# Patient Record
Sex: Female | Born: 1970 | Race: White | Hispanic: No | Smoking: Current every day smoker
Health system: Southern US, Community
[De-identification: ages and names within clinical notes are randomized; demographics above are authoritative.]

## PROBLEM LIST (undated history)

## (undated) DIAGNOSIS — B009 Herpesviral infection, unspecified: Secondary | ICD-10-CM

## (undated) DIAGNOSIS — Z87442 Personal history of urinary calculi: Secondary | ICD-10-CM

## (undated) DIAGNOSIS — T7840XA Allergy, unspecified, initial encounter: Secondary | ICD-10-CM

## (undated) HISTORY — PX: ABDOMINAL HYSTERECTOMY: SHX81

## (undated) HISTORY — DX: Herpesviral infection, unspecified: B00.9

## (undated) HISTORY — DX: Allergy, unspecified, initial encounter: T78.40XA

---

## 1998-08-19 HISTORY — PX: BREAST ENHANCEMENT SURGERY: SHX7

## 2007-06-26 ENCOUNTER — Ambulatory Visit (HOSPITAL_COMMUNITY): Admission: RE | Admit: 2007-06-26 | Discharge: 2007-06-26 | Payer: Self-pay | Admitting: Obstetrics and Gynecology

## 2007-06-26 ENCOUNTER — Encounter (INDEPENDENT_AMBULATORY_CARE_PROVIDER_SITE_OTHER): Payer: Self-pay | Admitting: Obstetrics and Gynecology

## 2009-02-06 ENCOUNTER — Ambulatory Visit (HOSPITAL_COMMUNITY): Admission: RE | Admit: 2009-02-06 | Discharge: 2009-02-07 | Payer: Self-pay | Admitting: Obstetrics and Gynecology

## 2009-02-06 ENCOUNTER — Encounter (INDEPENDENT_AMBULATORY_CARE_PROVIDER_SITE_OTHER): Payer: Self-pay | Admitting: Obstetrics and Gynecology

## 2010-05-28 ENCOUNTER — Emergency Department (HOSPITAL_COMMUNITY): Admission: EM | Admit: 2010-05-28 | Discharge: 2010-05-28 | Payer: Self-pay | Admitting: Emergency Medicine

## 2010-11-01 LAB — URINALYSIS, ROUTINE W REFLEX MICROSCOPIC
Bilirubin Urine: NEGATIVE
Glucose, UA: NEGATIVE mg/dL
Ketones, ur: NEGATIVE mg/dL
Protein, ur: 100 mg/dL — AB
pH: 6 (ref 5.0–8.0)

## 2010-11-01 LAB — POCT I-STAT, CHEM 8
HCT: 41 % (ref 36.0–46.0)
Hemoglobin: 13.9 g/dL (ref 12.0–15.0)
Potassium: 4 mEq/L (ref 3.5–5.1)
Sodium: 140 mEq/L (ref 135–145)

## 2010-11-01 LAB — URINE CULTURE: Culture  Setup Time: 201110110145

## 2010-11-01 LAB — URINE MICROSCOPIC-ADD ON

## 2010-11-26 LAB — CBC
HCT: 37.6 % (ref 36.0–46.0)
Hemoglobin: 12.8 g/dL (ref 12.0–15.0)
MCHC: 33.9 g/dL (ref 30.0–36.0)
MCV: 86 fL (ref 78.0–100.0)
MCV: 87 fL (ref 78.0–100.0)
RBC: 3.67 MIL/uL — ABNORMAL LOW (ref 3.87–5.11)
RDW: 13.5 % (ref 11.5–15.5)
WBC: 12.3 10*3/uL — ABNORMAL HIGH (ref 4.0–10.5)

## 2010-11-26 LAB — COMPREHENSIVE METABOLIC PANEL
Alkaline Phosphatase: 44 U/L (ref 39–117)
BUN: 10 mg/dL (ref 6–23)
Calcium: 9 mg/dL (ref 8.4–10.5)
Glucose, Bld: 94 mg/dL (ref 70–99)
Potassium: 4.1 mEq/L (ref 3.5–5.1)
Total Protein: 7 g/dL (ref 6.0–8.3)

## 2010-11-26 LAB — BASIC METABOLIC PANEL
Chloride: 105 mEq/L (ref 96–112)
Creatinine, Ser: 0.9 mg/dL (ref 0.4–1.2)
GFR calc Af Amer: >60 mL/min (ref >60)

## 2010-11-26 LAB — PREGNANCY, URINE: Preg Test, Ur: NEGATIVE

## 2011-01-01 NOTE — H&P (Signed)
[name], [name]               ACCOUNT NO.:  [id number]   MEDICAL RECORD NO.:  [id number]          PATIENT TYPE:  AMB   LOCATION:  SDC                           FACILITY:  [facility]   PHYSICIAN:  Sherron Monday, MD        DATE OF BIRTH:  [birth year]   DATE OF ADMISSION:  06/26/2007  DATE OF DISCHARGE:                              HISTORY & PHYSICAL   ADMISSION DIAGNOSES:  1. Menorrhagia.  2. Polyp.  3. Simple endometrial hyperplasia.   PLANNED PROCEDURE:  Hysteroscopy, D&C, polypectomy.   HISTORY OF PRESENT ILLNESS:  A 40 year old gravida 4, para 3-0-1-3  presents with clotting and heavy menstrual cycles for the last six  months as well as bloating and pressure.  [name] states that [name] was told  that [name] had a polyp or fibroid in her uterus.  Urine pregnancy test was  negative on presentation.   PAST MEDICAL HISTORY:  Not significant.   PAST SURGICAL HISTORY:  Significant for a D&C as well as breast  implants.   ALLERGIES:  PENICILLIN.   MEDICATIONS:  None.   SOCIAL HISTORY:  Occasional tobacco use, half a pack to a pack a week.  Occasional alcohol use.  No other drug use.  [name] is engaged and a  homemaker.   PAST OB/GYN HISTORY:  [name] has a history of multiple abnormal Pap smears  with colposcopy and a LEEP.  Her last was performed on May 20, 2007,  and was found to be within normal limits.  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  Her periods started when [name]  was 12 and they were regular and monthly until lately.  They have been,  for the last six months, clotting and heavy bleeding.  [name] is sexually  active with a single female partner.  They are monogamous.  [name] uses  nothing for contraception.  No endometrial bleeding, no postcoital  bleeding.  No discharge or dyspareunia.  [name] is a gravida 4, G1 was a 45-  week vaginal delivery of female infant weighing 4 pounds 9 ounces.  He is  disabled.  G2 was a term vaginal delivery female infant weighing 8  pounds complicated by PPROM at 27 weeks.  G3 was a missed abortion  requiring a D&C.  G4 was a 35-week spontaneous vaginal delivery of a  female weighing 4 pounds 12 ounces.  [name] also has three soon-to-be  stepchildren and so they have six children ages 50-17.   FAMILY HISTORY:  Significant for diabetes in a father and paternal  grandmother.  Hypertension in maternal grandmother and paternal  grandmother.  Cancer of liver, bone, and throat in maternal grandfather.  Uterine and liver cancer in a paternal grandmother.  Birth defects in  her son who is also mentally retarded.   PHYSICAL EXAMINATION:  VITAL SIGNS:  [name] is 5 feet 5-1/2 inches tall,  weighs 113 pounds.  Blood pressure 100/70.  GENERAL:  [name] is in no apparent distress.  CARDIOVASCULAR:  Regular rate and rhythm.  LUNGS:  Clear to auscultation bilaterally.  NECK:  Without  lymphadenopathy.  HEENT:  Mucous membranes are moist.  Thyroid is within normal limits.  BACK:  No costovertebral angle tenderness.  BREASTS:  C to D cup, no masses, nontender, and no distortion.  ABDOMEN:  Soft, nontender, nondistended, and positive bowel sounds.  EXTREMITIES:  Symmetric and nontender.  PELVIC:  Normal external female genitalia and normal Bartholin's,  urethra, and Skene's glands.  The cervix and vagina are without lesions.  No cervical motion tenderness.  Her uterus is anteverted, approximately  six weeks size, and otherwise normal.  Her adnexa are with no masses and  nontender.  An endometrial biopsy was performed.  The uterus sounded to  approximately 2 cm and this biopsy was benign.  Part of this being  performed is saline infused sonohysterogram was performed and revealed a  polyp in her uterine cavity.   LABORATORY DATA:  At this visit her hemoglobin is 13.7.  Her urine was  negative.   [name] returned to discuss with her husband with the endometrial biopsy of  simple hyperplasia, the less than 1% chance of endometrial cancer, and  her  options included hysterectomy versus polypectomy D&C, and plus or  minus on the progesterone therapy.  I discussed with the patient and her  husband the options.  They were unsure if they desired future fertility,  so polypectomy and D&C is a likely better option.  Reviewed with them  the need to do q.3 month biopsies to assure no further progression and  the D&C to assure that there is no more serious hyperplasia than simple  and the need to delay conception for approximately six months.  I  discussed with the patient and her husband the risks, benefits, and  alternatives including bleeding, infection, damage to the surrounding  organs, and possibility of continued hyperplasia.  They voiced  understanding of all of this and wished to proceed.  With the pathology  report, we will discuss if we need to start progesterone therapy.      Sherron Monday, MD  Electronically Signed     JB/MEDQ  D:  06/25/2007  T:  06/25/2007  Job:  [id number]

## 2011-01-01 NOTE — Op Note (Signed)
[name], [name]               ACCOUNT NO.:  [id number]   MEDICAL RECORD NO.:  [id number]          PATIENT TYPE:  AMB   LOCATION:  SDC                           [facility]ACILITY:  [facility]   PHYSICIAN:  Sherron Monday, MD        DATE OF BIRTH:  1971-07-23   DATE OF PROCEDURE:  02/06/2009  DATE OF DISCHARGE:                               OPERATIVE REPORT   PREOPERATIVE DIAGNOSES:  1. Menorrhagia.  2. History simple hyperplasia.  3. Uterine polyp.   POSTOPERATIVE DIAGNOSES:  1. Menorrhagia.  2. History simple hyperplasia.  3. Uterine polyp.   PROCEDURES:  1. Total vaginal hysterectomy.  2. Cystoscopy.   SURGEON:  Sherron Monday, MD   ASSISTANT:  Malachi Pro. Ambrose Mantle, MD   ANESTHESIA:  General.   FINDINGS:  Normal uterus, normal bilateral ovaries and tubes, blue from  both ureteral orifices after indigo carmine instillation at the end of  surgery.   SPECIMEN:  Uterus and cervix to pathology.   ESTIMATED BLOOD LOSS:  100 mL.   INTRAVENOUS FLUIDS:  800 mL.   URINE OUTPUT:  400 mL clear urine at the end of the procedure.   COMPLICATIONS:  None.   DISPOSITION:  Stable to PACU.   PROCEDURE:  After informed consent was reviewed with the patient  including risks, benefits, and alternatives of surgical procedure.  She  was transported in stable condition to the OR.  She was placed on the  table in supine position.  General anesthesia was induced and found be  adequate.  She was then placed in the [place] stirrups, prepped and  draped in the normal sterile fashion.  Using a heavy weighted speculum  and a Sims retractor, her cervix was easily identified and grasped with  Loman [place] and infused with 20 mL of Pitressin.  It was then  circumscribed with Bovie cautery.  The posterior cul-de-sac was then  entered sharply and a stitch was placed in the midline of the posterior  cul-de-sac, tacking the peritoneum to the vaginal mucosa.  The  uterosacral ligaments were plicated bilaterally and  the stitches were  held.  The long weighted speculum was placed, another bite was the  cardinal ligaments, and uterine arteries.  The uterine cornu was doubly  ligated and excised.  The ovaries were then identified and inspected.  The posterior cuff was noted to be bleeding.  This was made hemostatic  with Bovie cautery and a stitch.  The uterosacral ligaments were  plicated using a plicating suture and then the pedicales were tied to  help pull the ligaments together.  The cuff was closed with 2-0 Vicryl  in a running locked fashion.  The bladder was inspected using a  cystoscope.  After indigo carmine had been given, spurts from bilateral  ureteral orifices were noted.  Instruments were removed from the vagina  and the patient was returned to supine position, awakened in stable  condition, and transported to the PACU following the procedure.      Sherron Monday, MD  Electronically Signed     JB/MEDQ  D:  02/06/2009  T:  02/07/2009  Job:  [id number]

## 2011-01-01 NOTE — H&P (Signed)
[name], [name]               ACCOUNT NO.:  [id number]   MEDICAL RECORD NO.:  [id number]          PATIENT TYPE:  AMB   LOCATION:  SDC                           [facility]   PHYSICIAN:  Sherron Monday, MD        DATE OF BIRTH:  July 15, 1971   DATE OF ADMISSION:  02/06/2009  DATE OF DISCHARGE:                              HISTORY & PHYSICAL   ADMISSION DIAGNOSES:  Menorrhagia, history of simple hyperplasia,  desires definitive management.   PROCEDURES PLANNED:  Total vaginal hysterectomy, cystoscopy.   HISTORY OF PRESENT ILLNESS:  [name] is a 40 year old G3, P 3-0-0-3  who presents with a history of menorrhagia and in the past had an  endometrial biopsy that revealed simple hyperplasia and a polyp, which  was treated with a hysteroscopy, D and C.  She represents with the same  complaints and states she is ready for definitive management.  Discussed  with her the risks, benefits, and alternatives of the surgery including  bleeding, infection, damage to the surrounding organs, and no future  fertility.  She voices understanding and wishes to proceed.   PAST MEDICAL HISTORY:  Nonsignificant.   PAST SURGICAL HISTORY:  Significant for hysteroscopy, D and C in 2008 as  well as a breast augmentation.   PAST OBSTETRICS AND GYNECOLOGIC HISTORY:  She is a G3, P 3-0-0-3 with a  term vaginal delivery and two 35-week vaginal deliveries.  She also has  3 stepchildren, so in the household they will have 6 kids.  She states  she has had an abnormal Pap smear and a colposcopy and they have been  normal since, no sexually transmitted diseases.  She is sexually active  and monogamous with her husband.  She has regular heavy monthly menses  and has no complaints of dyspareunia, intermenstrual bleeding, or  postcoital bleeding.   MEDICATIONS:  None.   ALLERGIES:  PENICILLIN, which is as a child she had a choking reaction.   SOCIAL HISTORY:  History of occasional tobacco use.  She is  trying to  quit.  Occasional alcohol use.  No other drug use.   FAMILY HISTORY:  Significant for diabetes in a father and paternal  grandmother.  Hypertension in maternal grandmother and paternal  grandmother.  Paternal grandfather with liver, bone, and throat cancer.  Paternal grandmother with uterine and liver cancer.  A son with mental  retardation.   PHYSICAL EXAMINATION:  VITAL SIGNS:  Afebrile.  Vital signs stable.  GENERAL:  No apparent distress.  CARDIOVASCULAR:  Regular rate and rhythm.  LUNGS:  Clear to auscultation bilaterally.  ABDOMEN:  Soft, nontender, and nondistended with good bowel sounds.  EXTREMITIES:  Symmetric and nontender.  NECK:  No lymphadenopathy.  Thyroid within normal limits.  HEENT: Mucous membranes are moist.  BREASTS:  D cup.  No masses, nontender.  No distortion, obvious  implants.  BACK:  No costovertebral angle tenderness.  PELVIC:  Normal external female genitalia.  Normal Bartholin, urethral  and Skene glands, good support.  Cervix and vagina without lesions.  No  cervical motion tenderness.  Normal  uterus and adnexa, no masses,  nontender.   She recently has had an endometrial biopsy that was benign.  This is a  40 year old G3, P3 who desires definitive management for menorrhagia  with history of simple hyperplasia and endometrial polyps.  After  discussing options for the hysterectomy, we will proceed with a  hysterectomy on February 06, 2009.  We have discussed risks, benefits, and  alternatives.  She voices understanding to all of this as well as to  recovery.      Sherron Monday, MD  Electronically Signed     JB/MEDQ  D:  02/03/2009  T:  02/04/2009  Job:  [id number]

## 2011-01-01 NOTE — Op Note (Signed)
[name], [name]               ACCOUNT NO.:  [id number]   MEDICAL RECORD NO.:  [id number]          PATIENT TYPE:  AMB   LOCATION:  SDC                           [facility]   PHYSICIAN:  Sherron Monday, MD        DATE OF BIRTH:  [birth year]   DATE OF PROCEDURE:  06/26/2007  DATE OF DISCHARGE:                               OPERATIVE REPORT   PREOPERATIVE DIAGNOSIS:  Menorrhagia, uterine polyp, simple hyperplasia.   POSTOPERATIVE DIAGNOSIS:  Menorrhagia, uterine polyp, simple  hyperplasia.   PROCEDURE:  Hysteroscopy, polypectomy, dilation and curettage.   SURGEON:  Sherron Monday, M.D.   ASSISTANT:  None.   ANESTHESIA:  General with 13 mL of 1% lidocaine for a cervical block.   FINDINGS:  Uterine polyp noted at the left cornu.   SPECIMENS:  Polyps and endometrial curettings to pathology.   ESTIMATED BLOOD LOSS:  Minimal.   IV FLUIDS:  500 mL.   URINE OUTPUT:  I&O catheterization at the start of the procedure.   COMPLICATIONS:  None.   DISPOSITION:  Stable to PACU.   PROCEDURE:  After informed consent was reviewed with the patient  including risks, benefits and alternatives of surgical procedure with  simple hyperplasia having less than 1% chance of being a endometrial  carcinoma and [name] not being sure of her future fertility  desires, decision was made to proceed with a D&C hysteroscopy,  polypectomy.  I discussed with the patient risks, benefits and  alternatives of the surgical procedure including but not limited to  bleeding, infection, damage to surrounding organs, bowels, bladder, and  damage to the uterus.   The patient was taken to the operating room, placed on the table in  supine position.  General anesthesia was induced and found to be  adequate.  She was then placed in the yellow fin stirrups, prepped and  draped in the normal sterile fashion.  Her bladder was sterilely drained  using a heavy weighted speculum and a Sims retractor.  Her cervix was  easily visualized, grasped with a single-toothed tenaculum and dilated  to accommodate the diagnostic hysteroscope.  Her uterus sounded to 8.5  cm.  The diagnostic hysteroscope was introduced in the cavity, and a  brief survey was performed revealing both ostia that were easily seen  and a polyp at her left cornu.  The hysteroscope was removed.  The polyp  was grasped with polyp forceps and removed easily.  Inspection was  performed and again with the hysteroscope and found that the polyp had  been removed.  The D&C was then performed.  At the end of the  procedure, the bleeding was within normal limits.  The deficit was  approximately 25 mL.  The patient tolerated the procedure well.  Sponge,  lap and needle counts were correct x2.  The patient was awake and in  stable condition, transferred to the PACU.      Sherron Monday, MD  Electronically Signed     JB/MEDQ  D:  06/26/2007  T:  06/26/2007  Job:  [id number]

## 2011-01-01 NOTE — Discharge Summary (Signed)
NAMEJERI, [name]               ACCOUNT NO.:  [id number]   MEDICAL RECORD NO.:  [id number]          PATIENT TYPE:  OIB   LOCATION:  9319                          [facility]ACILITY:  [facility]   PHYSICIAN:  Sherron Monday, MD        DATE OF BIRTH:  1971/01/14   DATE OF ADMISSION:  02/06/2009  DATE OF DISCHARGE:  02/07/2009                               DISCHARGE SUMMARY   ADMISSION DIAGNOSES:  Menorrhagia, history of simple hyperplasia,  uterine polyps.   POSTOPERATIVE DIAGNOSES:  Menorrhagia, history of simple hyperplasia,  uterine polyps.   PROCEDURE:  During hospitalization, total vaginal hysterectomy and  cystoscopy.  For the full H and P, please refer to the dictated note.  However in brief, 40 year old G3, P3, who presented with menorrhagia and  has a history of simple endometrial hypoplasia, who again represents  with menorrhagia and desired definitive management.  She underwent a  total vaginal hysterectomy on 21st without complication and her  postoperative course was relatively uncomplicated.  She remained  afebrile with stable vital signs throughout.  Her pain was well  controlled.  She was ambulating and voiding at the time of discharge and  she was able to tolerate a diet.  She was discharged home with routine  discharge instructions and numbers to call with any questions or  problems as well as prescriptions for Vicodin and Motrin.  She will  follow up in approximately 2 weeks.  At this time, we will discuss  pathology and to make sure she is doing all right.      Sherron Monday, MD  Electronically Signed     JB/MEDQ  D:  02/07/2009  T:  02/08/2009  Job:  [id number]

## 2011-05-27 ENCOUNTER — Emergency Department (HOSPITAL_COMMUNITY)
Admission: EM | Admit: 2011-05-27 | Discharge: 2011-05-27 | Disposition: A | Discharge status: Home / Self Care | Payer: Medicaid Other | Attending: Emergency Medicine | Admitting: Emergency Medicine

## 2011-05-27 DIAGNOSIS — R3915 Urgency of urination: Secondary | ICD-10-CM | POA: Insufficient documentation

## 2011-05-27 DIAGNOSIS — R319 Hematuria, unspecified: Secondary | ICD-10-CM | POA: Insufficient documentation

## 2011-05-27 DIAGNOSIS — R109 Unspecified abdominal pain: Secondary | ICD-10-CM | POA: Insufficient documentation

## 2011-05-27 DIAGNOSIS — R300 Dysuria: Secondary | ICD-10-CM | POA: Insufficient documentation

## 2011-05-27 DIAGNOSIS — Z87440 Personal history of urinary (tract) infections: Secondary | ICD-10-CM | POA: Insufficient documentation

## 2011-05-27 DIAGNOSIS — N3090 Cystitis, unspecified without hematuria: Secondary | ICD-10-CM | POA: Insufficient documentation

## 2011-05-27 LAB — URINALYSIS, ROUTINE W REFLEX MICROSCOPIC
Glucose, UA: NEGATIVE mg/dL
Specific Gravity, Urine: 1.004 — ABNORMAL LOW (ref 1.005–1.030)
pH: 7.5 (ref 5.0–8.0)

## 2011-05-27 LAB — URINE MICROSCOPIC-ADD ON

## 2011-05-28 LAB — CBC
HCT: 37.6
Hemoglobin: 12.9
WBC: 7.5

## 2011-05-29 LAB — URINE CULTURE

## 2011-09-22 ENCOUNTER — Emergency Department: Payer: Self-pay | Admitting: Emergency Medicine

## 2011-09-29 ENCOUNTER — Emergency Department: Payer: Self-pay | Admitting: Emergency Medicine

## 2013-07-12 ENCOUNTER — Other Ambulatory Visit: Payer: Self-pay | Admitting: Family Medicine

## 2013-07-12 MED ORDER — AZITHROMYCIN 250 MG PO TABS: ORAL_TABLET | ORAL | Status: DC

## 2013-07-12 NOTE — Progress Notes (Signed)
Pt here with her son who is sick with likley strep and URI. Unable to be seen all of our appt blocked Sore throat, , no fever- PCN allergy, will send in Zpak

## 2013-07-26 ENCOUNTER — Encounter: Payer: Self-pay | Admitting: Family Medicine

## 2013-07-26 ENCOUNTER — Ambulatory Visit (INDEPENDENT_AMBULATORY_CARE_PROVIDER_SITE_OTHER): Payer: Medicaid Other | Admitting: Family Medicine

## 2013-07-26 VITALS — BP 112/78 | HR 82 | Temp 98.7°F | Resp 18 | Ht 65.0 in | Wt 124.0 lb

## 2013-07-26 DIAGNOSIS — J019 Acute sinusitis, unspecified: Secondary | ICD-10-CM

## 2013-07-26 DIAGNOSIS — Z13220 Encounter for screening for lipoid disorders: Secondary | ICD-10-CM

## 2013-07-26 DIAGNOSIS — Z90710 Acquired absence of both cervix and uterus: Secondary | ICD-10-CM

## 2013-07-26 DIAGNOSIS — Z01419 Encounter for gynecological examination (general) (routine) without abnormal findings: Secondary | ICD-10-CM

## 2013-07-26 DIAGNOSIS — F17200 Nicotine dependence, unspecified, uncomplicated: Secondary | ICD-10-CM

## 2013-07-26 DIAGNOSIS — B009 Herpesviral infection, unspecified: Secondary | ICD-10-CM

## 2013-07-26 DIAGNOSIS — J329 Chronic sinusitis, unspecified: Secondary | ICD-10-CM | POA: Insufficient documentation

## 2013-07-26 DIAGNOSIS — Z1231 Encounter for screening mammogram for malignant neoplasm of breast: Secondary | ICD-10-CM

## 2013-07-26 DIAGNOSIS — Z0000 Encounter for general adult medical examination without abnormal findings: Secondary | ICD-10-CM

## 2013-07-26 MED ORDER — VALACYCLOVIR HCL 500 MG PO TABS: 500.0000 mg | ORAL_TABLET | Freq: Two times a day (BID) | ORAL | Status: DC

## 2013-07-26 MED ORDER — NORGESTIMATE-ETH ESTRADIOL 0.25-35 MG-MCG PO TABS: 1.0000 | ORAL_TABLET | Freq: Every day | ORAL | Status: DC

## 2013-07-26 NOTE — Assessment & Plan Note (Signed)
She was only treated for one outbreak. 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 At this time we'll try treating only during exacerbations instead of daily prophylaxis

## 2013-07-26 NOTE — Progress Notes (Signed)
   Subjective:    Patient ID: [name], female    DOB: 10/02/1970, 42 y.o.   MRN: [id number]  HPI Patient here to reestablish care. She was last seen a couple years ago. She is followed by GYN however she needs a referral to continue followup. She is status post hysterectomy secondary to heavy bleeding and fibroids. She's been maintained on Sprintec for birth control however has been out for the past 3 weeks as she was due for an appointment. 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 She does have some decongestant home which helps.   Review of Systems  GEN- denies fatigue, fever, weight loss,weakness, recent illness HEENT- denies eye drainage, change in vision, nasal discharge, CVS- denies chest pain, palpitations RESP- denies SOB, cough, wheeze ABD- denies N/V, change in stools, abd pain GU- denies dysuria, hematuria, dribbling, incontinence MSK- denies joint pain, muscle aches, injury Neuro- denies headache, dizziness, syncope, seizure activity      Objective:   Physical Exam GEN- NAD, alert and oriented x3 HEENT- PERRL, EOMI, non injected sclera, pink conjunctiva, MMM, oropharynx clear injection, TM clear bilat no effusion, + maxillary sinus tenderness, nares clear Neck- Supple, no LAD CVS- RRR, no murmur RESP-CTAB EXT- No edema Pulses- Radial 2+         Assessment & Plan:

## 2013-07-26 NOTE — Assessment & Plan Note (Signed)
She was recently on antibiotics. We'll hold on her decongestant the next couple of days and see if she improves.

## 2013-07-26 NOTE — Assessment & Plan Note (Signed)
I will send in a new referral for her GYN tissue to reestablish care. Mammogram is also to be done. I refilled her Sprintec for the next couple of months

## 2013-07-26 NOTE — Assessment & Plan Note (Signed)
Counseled on cessation 

## 2013-07-26 NOTE — Patient Instructions (Signed)
Use Decongestant and mucniex Call if no improvement For outbreaks use Valtrex  1 tablet twice a day for 5 days Mammogram to be scheduled Return for fasting labs  Referral to GYN- Dr. Ellyn Hack F/U as needed

## 2013-08-24 ENCOUNTER — Other Ambulatory Visit: Payer: Self-pay | Admitting: Family Medicine

## 2013-08-24 ENCOUNTER — Ambulatory Visit
Admission: RE | Admit: 2013-08-24 | Discharge: 2013-08-24 | Disposition: A | Discharge status: Home / Self Care | Payer: Medicaid Other | Source: Ambulatory Visit | Attending: Family Medicine | Admitting: Family Medicine

## 2013-08-24 DIAGNOSIS — Z1231 Encounter for screening mammogram for malignant neoplasm of breast: Secondary | ICD-10-CM

## 2013-08-27 ENCOUNTER — Other Ambulatory Visit: Payer: Self-pay | Admitting: Family Medicine

## 2013-08-27 DIAGNOSIS — R928 Other abnormal and inconclusive findings on diagnostic imaging of breast: Secondary | ICD-10-CM

## 2013-09-03 ENCOUNTER — Ambulatory Visit
Admission: RE | Admit: 2013-09-03 | Discharge: 2013-09-03 | Disposition: A | Discharge status: Home / Self Care | Payer: Medicaid Other | Source: Ambulatory Visit | Attending: Family Medicine | Admitting: Family Medicine

## 2013-09-03 ENCOUNTER — Other Ambulatory Visit: Payer: Self-pay | Admitting: Family Medicine

## 2013-09-03 DIAGNOSIS — R921 Mammographic calcification found on diagnostic imaging of breast: Secondary | ICD-10-CM

## 2013-09-03 DIAGNOSIS — R928 Other abnormal and inconclusive findings on diagnostic imaging of breast: Secondary | ICD-10-CM

## 2013-09-09 ENCOUNTER — Telehealth: Payer: Self-pay | Admitting: *Deleted

## 2013-09-09 NOTE — Telephone Encounter (Signed)
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

## 2013-09-10 NOTE — Telephone Encounter (Signed)
If her symptoms are body aches and chills, this sounds most consistent with a virus. If having high fever  >101--let me know-- then I would consider Tamiflu. Otherwise, sounds like a virus which will just have to run its course. Use Tylenol and Motrin to help with the aches and pains in the meantime.

## 2013-09-13 ENCOUNTER — Ambulatory Visit
Admission: RE | Admit: 2013-09-13 | Discharge: 2013-09-13 | Disposition: A | Discharge status: Home / Self Care | Payer: Medicaid Other | Source: Ambulatory Visit | Attending: Family Medicine | Admitting: Family Medicine

## 2013-09-13 DIAGNOSIS — R921 Mammographic calcification found on diagnostic imaging of breast: Secondary | ICD-10-CM

## 2013-09-14 NOTE — Telephone Encounter (Signed)
Pt feeling much better.  Did not have high fever.  Has treated effectively with OTC relief.

## 2013-10-05 ENCOUNTER — Ambulatory Visit
Admission: RE | Admit: 2013-10-05 | Discharge: 2013-10-05 | Disposition: A | Discharge status: Home / Self Care | Payer: Medicaid Other | Source: Ambulatory Visit | Attending: Family Medicine | Admitting: Family Medicine

## 2013-10-05 ENCOUNTER — Other Ambulatory Visit: Payer: Self-pay | Admitting: Family Medicine

## 2013-10-05 DIAGNOSIS — N61 Mastitis without abscess: Secondary | ICD-10-CM

## 2014-05-30 ENCOUNTER — Other Ambulatory Visit: Payer: Self-pay | Admitting: Family Medicine

## 2014-05-30 NOTE — Telephone Encounter (Signed)
Medication filled x1 with no refills.   Requires office visit before any further refills can be given.   Letter sent.  

## 2014-06-21 ENCOUNTER — Telehealth: Payer: Self-pay | Admitting: Family Medicine

## 2014-06-21 MED ORDER — VALACYCLOVIR HCL 500 MG PO TABS: ORAL_TABLET | ORAL | Status: DC

## 2014-06-21 NOTE — Telephone Encounter (Signed)
Prescription sent to pharmacy. .   Call placed to patient and patient made aware.  

## 2014-06-21 NOTE — Telephone Encounter (Signed)
Patient no longer has insurance and can't afford this medication from [facility] she has a coupon for [facility] and would like this medication called into [facility] [address]. Please call her once this has been called in.

## 2014-09-29 ENCOUNTER — Encounter (HOSPITAL_COMMUNITY): Payer: Self-pay | Admitting: Emergency Medicine

## 2014-09-29 ENCOUNTER — Emergency Department (HOSPITAL_COMMUNITY)
Admission: EM | Admit: 2014-09-29 | Discharge: 2014-09-29 | Disposition: A | Discharge status: Home / Self Care | Payer: Medicaid Other | Attending: Emergency Medicine | Admitting: Emergency Medicine

## 2014-09-29 ENCOUNTER — Emergency Department (HOSPITAL_COMMUNITY)
Admission: EM | Admit: 2014-09-29 | Discharge: 2014-09-29 | Disposition: A | Discharge status: Home / Self Care | Payer: Self-pay | Attending: Emergency Medicine | Admitting: Emergency Medicine

## 2014-09-29 ENCOUNTER — Emergency Department (HOSPITAL_COMMUNITY): Payer: Medicaid Other

## 2014-09-29 ENCOUNTER — Encounter (HOSPITAL_COMMUNITY): Payer: Self-pay

## 2014-09-29 DIAGNOSIS — R42 Dizziness and giddiness: Secondary | ICD-10-CM | POA: Insufficient documentation

## 2014-09-29 DIAGNOSIS — R4789 Other speech disturbances: Secondary | ICD-10-CM

## 2014-09-29 DIAGNOSIS — H539 Unspecified visual disturbance: Secondary | ICD-10-CM

## 2014-09-29 DIAGNOSIS — R110 Nausea: Secondary | ICD-10-CM | POA: Insufficient documentation

## 2014-09-29 DIAGNOSIS — H547 Unspecified visual loss: Secondary | ICD-10-CM | POA: Insufficient documentation

## 2014-09-29 DIAGNOSIS — Z8619 Personal history of other infectious and parasitic diseases: Secondary | ICD-10-CM | POA: Insufficient documentation

## 2014-09-29 DIAGNOSIS — Z720 Tobacco use: Secondary | ICD-10-CM | POA: Insufficient documentation

## 2014-09-29 DIAGNOSIS — R519 Headache, unspecified: Secondary | ICD-10-CM | POA: Insufficient documentation

## 2014-09-29 DIAGNOSIS — Z793 Long term (current) use of hormonal contraceptives: Secondary | ICD-10-CM | POA: Insufficient documentation

## 2014-09-29 DIAGNOSIS — H5340 Unspecified visual field defects: Secondary | ICD-10-CM

## 2014-09-29 DIAGNOSIS — Z880 Allergy status to penicillin: Secondary | ICD-10-CM | POA: Insufficient documentation

## 2014-09-29 DIAGNOSIS — G43509 Persistent migraine aura without cerebral infarction, not intractable, without status migrainosus: Secondary | ICD-10-CM | POA: Insufficient documentation

## 2014-09-29 DIAGNOSIS — Z79899 Other long term (current) drug therapy: Secondary | ICD-10-CM | POA: Insufficient documentation

## 2014-09-29 DIAGNOSIS — R51 Headache: Secondary | ICD-10-CM | POA: Insufficient documentation

## 2014-09-29 LAB — RAPID URINE DRUG SCREEN, HOSP PERFORMED
Amphetamines: NOT DETECTED
BENZODIAZEPINES: NOT DETECTED
Barbiturates: NOT DETECTED
Cocaine: NOT DETECTED
Opiates: NOT DETECTED
Tetrahydrocannabinol: NOT DETECTED

## 2014-09-29 LAB — CBC
HCT: 40.9 % (ref 36.0–46.0)
HEMOGLOBIN: 13.7 g/dL (ref 12.0–15.0)
MCH: 28 pg (ref 26.0–34.0)
MCHC: 33.5 g/dL (ref 30.0–36.0)
MCV: 83.6 fL (ref 78.0–100.0)
Platelets: 297 10*3/uL (ref 150–400)
RBC: 4.89 MIL/uL (ref 3.87–5.11)
RDW: 12.9 % (ref 11.5–15.5)
WBC: 8.6 10*3/uL (ref 4.0–10.5)

## 2014-09-29 LAB — I-STAT CHEM 8, ED
BUN: 10 mg/dL (ref 6–23)
CALCIUM ION: 1.26 mmol/L — AB (ref 1.12–1.23)
Chloride: 103 mmol/L (ref 96–112)
Creatinine, Ser: 0.9 mg/dL (ref 0.50–1.10)
Glucose, Bld: 101 mg/dL — ABNORMAL HIGH (ref 70–99)
HEMATOCRIT: 42 % (ref 36.0–46.0)
HEMOGLOBIN: 14.3 g/dL (ref 12.0–15.0)
Potassium: 4.1 mmol/L (ref 3.5–5.1)
Sodium: 140 mmol/L (ref 135–145)
TCO2: 24 mmol/L (ref 0–100)

## 2014-09-29 LAB — ETHANOL: Alcohol, Ethyl (B): <5 mg/dL (ref 0–9)

## 2014-09-29 LAB — COMPREHENSIVE METABOLIC PANEL
ALBUMIN: 4.1 g/dL (ref 3.5–5.2)
ALK PHOS: 60 U/L (ref 39–117)
ALT: 16 U/L (ref 0–35)
AST: 21 U/L (ref 0–37)
Anion gap: 9 (ref 5–15)
BUN: 8 mg/dL (ref 6–23)
CHLORIDE: 105 mmol/L (ref 96–112)
CO2: 25 mmol/L (ref 19–32)
Calcium: 9.5 mg/dL (ref 8.4–10.5)
Creatinine, Ser: 0.82 mg/dL (ref 0.50–1.10)
GFR calc Af Amer: >90 mL/min (ref >90)
GFR calc non Af Amer: 86 mL/min — ABNORMAL LOW (ref >90)
Glucose, Bld: 109 mg/dL — ABNORMAL HIGH (ref 70–99)
Potassium: 4.3 mmol/L (ref 3.5–5.1)
SODIUM: 139 mmol/L (ref 135–145)
TOTAL PROTEIN: 7.1 g/dL (ref 6.0–8.3)
Total Bilirubin: 0.5 mg/dL (ref 0.3–1.2)

## 2014-09-29 LAB — DIFFERENTIAL
BASOS PCT: 1 % (ref 0–1)
Basophils Absolute: 0 10*3/uL (ref 0.0–0.1)
EOS ABS: 0.1 10*3/uL (ref 0.0–0.7)
EOS PCT: 1 % (ref 0–5)
Lymphocytes Relative: 26 % (ref 12–46)
Lymphs Abs: 2.2 10*3/uL (ref 0.7–4.0)
Monocytes Absolute: 0.6 10*3/uL (ref 0.1–1.0)
Monocytes Relative: 6 % (ref 3–12)
Neutro Abs: 5.7 10*3/uL (ref 1.7–7.7)
Neutrophils Relative %: 66 % (ref 43–77)

## 2014-09-29 LAB — URINE MICROSCOPIC-ADD ON

## 2014-09-29 LAB — URINALYSIS, ROUTINE W REFLEX MICROSCOPIC
Bilirubin Urine: NEGATIVE
Glucose, UA: NEGATIVE mg/dL
KETONES UR: NEGATIVE mg/dL
Leukocytes, UA: NEGATIVE
NITRITE: NEGATIVE
PROTEIN: NEGATIVE mg/dL
Specific Gravity, Urine: 1.009 (ref 1.005–1.030)
UROBILINOGEN UA: 0.2 mg/dL (ref 0.0–1.0)
pH: 7.5 (ref 5.0–8.0)

## 2014-09-29 LAB — I-STAT TROPONIN, ED: TROPONIN I, POC: 0 ng/mL (ref 0.00–0.08)

## 2014-09-29 LAB — APTT: APTT: 31 s (ref 24–37)

## 2014-09-29 LAB — PROTIME-INR
INR: 0.99 (ref 0.00–1.49)
Prothrombin Time: 13.2 seconds (ref 11.6–15.2)

## 2014-09-29 MED ORDER — DIPHENHYDRAMINE HCL 50 MG/ML IJ SOLN
25.0000 mg | Freq: Once | INTRAMUSCULAR | Status: AC
  Administered 2014-09-29: 25 mg via INTRAVENOUS
  Filled 2014-09-29: qty 1

## 2014-09-29 MED ORDER — DEXAMETHASONE SODIUM PHOSPHATE 10 MG/ML IJ SOLN
10.0000 mg | Freq: Once | INTRAMUSCULAR | Status: AC
  Administered 2014-09-29: 10 mg via INTRAVENOUS
  Filled 2014-09-29: qty 1

## 2014-09-29 MED ORDER — METOCLOPRAMIDE HCL 5 MG/ML IJ SOLN
10.0000 mg | Freq: Once | INTRAMUSCULAR | Status: AC
  Administered 2014-09-29: 10 mg via INTRAVENOUS
  Filled 2014-09-29: qty 2

## 2014-09-29 MED ORDER — LORAZEPAM 1 MG PO TABS
2.0000 mg | ORAL_TABLET | Freq: Once | ORAL | Status: AC
  Administered 2014-09-29: 2 mg via ORAL
  Filled 2014-09-29: qty 2

## 2014-09-29 NOTE — Discharge Instructions (Signed)
Blurred Vision You have been seen today complaining of blurred vision. This means you have a loss of ability to see small details.  CAUSES  Blurred vision can be a symptom of underlying eye problems, such as:  Aging of the eye (presbyopia).  Glaucoma.  Cataracts.  Eye infection.  Eye-related migraine.  Diabetes mellitus.  Fatigue.  Migraine headaches.  High blood pressure.  Breakdown of the back of the eye (macular degeneration).  Problems caused by some medications. The most common cause of blurred vision is the need for eyeglasses or a new prescription. Today in the emergency department, no cause for your blurred vision can be found. SYMPTOMS  Blurred vision is the loss of visual sharpness and detail (acuity). DIAGNOSIS  Should blurred vision continue, you should see your caregiver. If your caregiver is your primary care physician, he or she may choose to refer you to another specialist.  TREATMENT  Do not ignore your blurred vision. Make sure to have it checked out to see if further treatment or referral is necessary. SEEK MEDICAL CARE IF:  You are unable to get into a specialist so we can help you with a referral. SEEK IMMEDIATE MEDICAL CARE IF: You have severe eye pain, severe headache, or sudden loss of vision. MAKE SURE YOU:   Understand these instructions.  Will watch your condition.  Will get help right away if you are not doing well or get worse. Document Released: 08/08/2003 Document Revised: 10/28/2011 Document Reviewed: 03/09/2008 [facility] Patient Information 2015 [place], [place]. This information is not intended to replace advice given to you by your health care provider. Make sure you discuss any questions you have with your health care provider.  Aphasia Aphasia is a neurological disorder caused by damage to the parts of the brain that control language. CAUSES  Aphasia is not a disease, but a symptom of brain damage. Aphasia is commonly seen in adults  who have suffered a stroke. Aphasia also can result from:  A brain tumor.  Infection.  Head injury.  A rare type of dementia called Primary Progressive Aphasia. Common types of dementia may be associated with aphasia but can also exist without language problems. SYMPTOMS  Primary signs of the disorder include:  Problems expressing oneself when speaking.  Trouble understanding speech.  Difficulty with reading and writing.  Speaking in short or incomplete sentences.  Speaking in sentences that don't make sense.  Speaking unrecognizable words.  Interpreting figurative language literally.  Writing sentences that don't make sense. The type and severity of language problems depend on the precise location and extent of the damaged brain tissue. Aphasia can be divided into four broad categories:  Expressive aphasia - difficulty in conveying thoughts through speech or writing. The patient knows what they want to say, but cannot find the words they need.  Receptive aphasia - difficulty understanding spoken or written language. The patient hears the voice or sees the print but cannot make sense of the words.  Anomic or amnesia aphasia - difficulty in using the correct names for particular objects, people, places, or events. This is the least severe form of aphasia.  Global aphasia results from severe and extensive damage to the language areas of the brain. Patients lose almost all language function, both comprehension (understanding) and expression. They cannot speak, understand speech, read, or write. TREATMENT  Sometimes an individual will completely recover from aphasia without treatment. In most cases, language therapy should begin as soon as possible. Language therapy should be tailored to  the individual needs of the patient. Therapy with a speech pathologist involves exercises in which patients:  Read.  Write.  Follow directions.  Repeat what they hear.  Computer-aided  therapy may also be used. PROGNOSIS  The outcome of aphasia is difficult to predict. People who are younger or have less extensive brain damage do better. The location of the injury is also important. The location is a clue to prognosis. In general, patients tend to recover skills in language comprehension (understanding) more completely than those skills involving expression (speaking or writing). Document Released: 04/27/2002 Document Revised: 10/28/2011 Document Reviewed: 10/25/2013 [facility] Patient Information 2015 [place]. This information is not intended to replace advice given to you by your health care provider. Make sure you discuss any questions you have with your health care provider.

## 2014-09-29 NOTE — ED Provider Notes (Addendum)
CSN: [id number]     Arrival date & time 09/29/14  1525 History   None    Chief Complaint  Patient presents with  . Aphasia     (Consider location/radiation/quality/duration/timing/severity/associated sxs/prior Treatment) HPI Comments: Had R sided visual field deficits - decreased vision, low vision. Patient also had episode of aphasia and word finding difficulty while talking to her husband.  Patient is a 44 y.o. female presenting with neurologic complaint. The history is provided by the patient.  Neurologic Problem This is a new problem. The current episode started 3 to 5 hours ago. The problem occurs constantly. The problem has not changed since onset.Pertinent negatives include no chest pain, no abdominal pain and no shortness of breath. Nothing aggravates the symptoms. Nothing relieves the symptoms. She has tried nothing for the symptoms. The treatment provided no relief.    Past Medical History  Diagnosis Date  . Allergy   . HSV-2 infection    Past Surgical History  Procedure Laterality Date  . Abdominal hysterectomy      44yo   Family History  Problem Relation Age of Onset  . Learning disabilities Son   . Cancer Maternal Grandmother    History  Substance Use Topics  . Smoking status: Current Every Day Smoker -- 0.50 packs/day  . Smokeless tobacco: Not on file  . Alcohol Use: Yes   OB History    No data available     Review of Systems  Constitutional: Negative for fever.  Respiratory: Negative for cough and shortness of breath.   Cardiovascular: Negative for chest pain and leg swelling.  Gastrointestinal: Negative for vomiting and abdominal pain.  All other systems reviewed and are negative.     Allergies  Penicillins  Home Medications   Prior to Admission medications   Medication Sig Start Date End Date Taking? Authorizing Provider  ibuprofen (ADVIL,MOTRIN) 200 MG tablet Take 600 mg by mouth every 6 (six) hours as needed.    Historical Provider, MD   norgestimate-ethinyl estradiol (SPRINTEC 28) 0.25-35 MG-MCG tablet Take 1 tablet by mouth daily. 07/26/13   Salley Scarlet, MD  valACYclovir (VALTREX) 500 MG tablet TAKE 1 TABLET (500 MG TOTAL) BY MOUTH 2 (TWO) TIMES DAILY. 06/21/14   Salley Scarlet, MD   BP 105/59 mmHg  Pulse 103  Temp(Src) 98.2 F (36.8 C) (Oral)  Resp 18  Ht  (1.651 m)  Wt 120 lb (54.432 kg)  BMI 19.97 kg/m2  SpO2 96% Physical Exam  Constitutional: She is oriented to person, place, and time. She appears well-developed and well-nourished. No distress.  HENT:  Head: Normocephalic and atraumatic.  Mouth/Throat: Oropharynx is clear and moist.  Eyes: EOM are normal. Pupils are equal, round, and reactive to light.  Neck: Normal range of motion. Neck supple.  Cardiovascular: Normal rate and regular rhythm.  Exam reveals no friction rub.   No murmur heard. Pulmonary/Chest: Effort normal and breath sounds normal. No respiratory distress. She has no wheezes. She has no rales.  Abdominal: Soft. She exhibits no distension. There is no tenderness. There is no rebound.  Musculoskeletal: Normal range of motion. She exhibits no edema.  Neurological: She is alert and oriented to person, place, and time. A cranial nerve deficit (R lateral visual field cut) is present. She exhibits normal muscle tone. Coordination normal.  Skin: She is not diaphoretic.  Nursing note and vitals reviewed.   ED Course  Procedures (including critical care time) Labs Review Labs Reviewed - No data to display  Imaging Review Ct Head Wo Contrast  09/29/2014   CLINICAL DATA:  Blurred vision, headache.  EXAM: CT HEAD WITHOUT CONTRAST  TECHNIQUE: Contiguous axial images were obtained from the base of the skull through the vertex without intravenous contrast.  COMPARISON:  None.  FINDINGS: Bony calvarium appears intact. No mass effect or midline shift is noted. Ventricular size is within normal limits. There is no evidence of mass lesion,  hemorrhage or acute infarction.  IMPRESSION: Normal head CT. Critical Value/emergent results were called by telephone at the time of interpretation on 09/29/2014 at 12:10 pm to Dr. Hosie PoissonSumner, who verbally acknowledged these results.   Electronically Signed   By: Lupita RaiderJames  Green Jr, M.D.   On: 09/29/2014 12:11     EKG Interpretation None      Date: 09/29/2014  Rate: 65  Rhythm: normal sinus rhythm  QRS Axis: normal  Intervals: normal  ST/T Wave abnormalities: normal  Conduction Disutrbances:none  Narrative Interpretation:   Old EKG Reviewed: none available   MDM   Final diagnoses:  Visual disturbance    57F here with aphasia, visual disturbances. Aphasia more like word-finding difficulty, resolved. Still having some blurred vision. Began about 3 hours ago, Code Stroke initiated. Here mild visual field cut on exam, otherwise normal. Seen by Neuro, wanted to get MR, if negative, could go home. Patient refused MR, was too claustrophobic at this time. I attempted to give her ativan, but she refused.  Instructed to f/u with Neuro, instructed she could return for MRI if she wanted.    Elwin MochaBlair Senai Ramnath, MD 09/29/14 [phone]  Elwin MochaBlair Chrisotpher Rivero, MD 09/29/14 [phone]

## 2014-09-29 NOTE — Code Documentation (Signed)
Patient arrived via private vehicle.  LKW at 0800.  Patient describes blurry vision while driving home this morning and difficulty seeing to her right.  She had some trouble finding her words while speaking to her mother in law.  When she stood up out of the vehicle she felt dizzy and nauseated.  She also describes a HA after she noted her blurred vision.  She has a history of migraines, but has not had a migraine in years. Head CT done, labs done.  NIHSS 2, right visual field vision disturbance, right sensory loss. Dr Hosie PoissonSumner at bedside to assess patient Plan MRI

## 2014-09-29 NOTE — ED Notes (Signed)
Pt presents with multiple complaints that began at 0810.  Pt reports she was driving, noted blurred vision in central fields of both eyes, followed by "black" vision to R peripheral vision that is resolving.  She reports at 0830, she was speaking on phone to family, reports inability to speak, reports "really hard finding my words".  Pt endorses dizziness, nausea and headache.

## 2014-09-29 NOTE — ED Provider Notes (Signed)
Please see note from Dr Gwendolyn GrantWalden from a few hours ago.  Pt presented to the ED with aphasia and headache.  Seen by neurology.  Plan was for MRI.  If negative, pt could go home.  Pt became claustrophobic.  Refused and left.  She returned 1 hour later back to the ED to have the MRI. Physical Exam  BP 105/59 mmHg  Pulse 103  Temp(Src) 98.2 F (36.8 C) (Oral)  Resp 18  Ht 5\' 5"  (1.651 m)  Wt 120 lb (54.432 kg)  BMI 19.97 kg/m2  SpO2 96%  Physical Exam  Constitutional: She appears well-developed and well-nourished. No distress.  HENT:  Head: Normocephalic and atraumatic.  Right Ear: External ear normal.  Left Ear: External ear normal.  Eyes: Conjunctivae are normal. Right eye exhibits no discharge. Left eye exhibits no discharge. No scleral icterus.  Neck: Neck supple. No tracheal deviation present.  Cardiovascular: Normal rate.   Pulmonary/Chest: Effort normal. No stridor. No respiratory distress.  Musculoskeletal: She exhibits no edema.  Neurological: She is alert. Cranial nerve deficit: no gross deficits.  Skin: Skin is warm and dry. No rash noted.  Psychiatric: She has a normal mood and affect.  Nursing note and vitals reviewed. Please see Dr Marguerita MerlesSumners and Dr Tyson AliasWalden's note for full evaluation.  ED Course  Procedures Ct Head Wo Contrast  09/29/2014   CLINICAL DATA:  Blurred vision, headache.  EXAM: CT HEAD WITHOUT CONTRAST  TECHNIQUE: Contiguous axial images were obtained from the base of the skull through the vertex without intravenous contrast.  COMPARISON:  None.  FINDINGS: Bony calvarium appears intact. No mass effect or midline shift is noted. Ventricular size is within normal limits. There is no evidence of mass lesion, hemorrhage or acute infarction.  IMPRESSION: Normal head CT. Critical Value/emergent results were called by telephone at the time of interpretation on 09/29/2014 at 12:10 pm to Dr. Hosie PoissonSumner, who verbally acknowledged these results.   Electronically Signed   By: Lupita RaiderJames   Green Jr, M.D.   On: 09/29/2014 12:11   Mr Brain Wo Contrast  09/29/2014   CLINICAL DATA:  Visual disturbance.  EXAM: MRI HEAD WITHOUT CONTRAST  TECHNIQUE: Multiplanar, multiecho pulse sequences of the brain and surrounding structures were obtained without intravenous contrast.  COMPARISON:  CT head 09/29/2014  FINDINGS: Ventricle size is normal. Negative for hydrocephalus. Cerebral volume is normal. Pituitary normal in size. Craniocervical junction is normal.  Negative for acute or chronic infarction.  Negative for demyelinating disease.  Negative for hemorrhage or fluid collection  Negative for mass or edema.  Minimal mucosal edema in the ethmoid sinuses.  IMPRESSION: Normal MRI of the brain without contrast.   Electronically Signed   By: Marlan Palauharles  Clark M.D.   On: 09/29/2014 18:26    MDM Pt's prior visit notes reviewed.  Dr Hosie PoissonSumner recommended MRI which patient has now had.  If negative, no further workup necessary. MRI scan is negative.  Most likely a complex migraine.     Linwood DibblesJon Blayden Conwell, MD 09/29/14 [phone]

## 2014-09-29 NOTE — Discharge Instructions (Signed)

## 2014-09-29 NOTE — ED Notes (Signed)
Pt returned from MRI. Notified MD. Pt reports being anxious and stated "she was not going to go into that machine." Encouraged pt to have test done. Attempted to calm pt down.

## 2014-09-29 NOTE — Consult Note (Signed)
Stroke Consult    Chief Complaint: vision loss, nausea and headache HPI: Jocelyn BostonKaren A [name] is an 44 y.o. female with unremarkable past medical history presents with multiple symptoms starting at 0810 this morning. Initially she notes a "blurring" of her right peripheral vision and shortly after developed a headache. She then developed bilateral central blurred vision which resolved. Right peripheral deficit persists. At 0830 she was talking on the phone when she describes word finding difficulty. She also notes dizziness (described as lightheaded upon standing) and nausea.   Has history of migraines but states this headache is atypical. No prior CVA or TIA. No family history of stroke at young age. No clotting history.   CT head imaging reviewed and was unremarkable.   Date last known well: 09/29/2014 Time last known well: 0810 tPA Given: no, too mild to treat and symptoms improving (NIHSS of 2)  Past Medical History  Diagnosis Date  . Allergy   . HSV-2 infection     Past Surgical History  Procedure Laterality Date  . Abdominal hysterectomy      44yo    Family History  Problem Relation Age of Onset  . Learning disabilities Son   . Cancer Maternal Grandmother    Social History:  reports that she has been smoking.  She does not have any smokeless tobacco history on file. She reports that she drinks alcohol. She reports that she does not use illicit drugs.  Allergies:  Allergies  Allergen Reactions  . Penicillins      (Not in a hospital admission)  ROS: Out of a complete 14 system review, the patient complains of only the following symptoms, and all other reviewed systems are negative. + headache, blurred vision    Physical Examination: Filed Vitals:   09/29/14 1203  BP: 117/70  Pulse: 70  Temp:   Resp: 18   Physical Exam  Constitutional: He appears well-developed and well-nourished.  Psych: Affect appropriate to situation Eyes: No scleral injection HENT: No OP  obstrucion Head: Normocephalic.  Cardiovascular: Normal rate and regular rhythm.  Respiratory: Effort normal and breath sounds normal.  GI: Soft. Bowel sounds are normal. No distension. There is no tenderness.  Skin: WDI   Neurologic Examination: Mental Status: Alert, oriented, thought content appropriate.  Speech fluent without evidence of aphasia.  Able to follow 3 step commands without difficulty. Cranial Nerves: II: funduscopic exam wnl bilaterally, decreased right inferior quadrant in bilateral eyes, pupils equal, round, reactive to light and accommodation III,IV, VI: ptosis not present, extra-ocular motions intact bilaterally V,VII: smile symmetric, decreased LT on right side VIII: hearing normal bilaterally IX,X: gag reflex present XI: trapezius strength/neck flexion strength normal bilaterally XII: tongue strength normal  Motor: Right : Upper extremity    Left:     Upper extremity 5/5 deltoid       5/5 deltoid 5/5 biceps      5/5 biceps  5/5 triceps      5/5 triceps 5/5 hand grip      5/5 hand grip  Lower extremity     Lower extremity 5/5 hip flexor      5/5 hip flexor 5/5 quadricep      5/5 quadriceps  5/5 hamstrings     5/5 hamstrings 5/5 plantar flexion       5/5 plantar flexion 5/5 plantar extension     5/5 plantar extension Tone and bulk:normal tone throughout; no atrophy noted Sensory: diminished LT and PP on RUE and RLE Deep Tendon Reflexes: 2+ and  symmetric throughout Plantars: Right: downgoing   Left: downgoing Cerebellar: normal finger-to-nose,  and normal heel-to-shin test Gait: deferred  Laboratory Studies:   Basic Metabolic Panel:  Recent Labs Lab 09/29/14 1215  NA 140  K 4.1  CL 103  GLUCOSE 101*  BUN 10  CREATININE 0.90    Liver Function Tests: No results for input(s): AST, ALT, ALKPHOS, BILITOT, PROT, ALBUMIN in the last 168 hours. No results for input(s): LIPASE, AMYLASE in the last 168 hours. No results for input(s): AMMONIA in  the last 168 hours.  CBC:  Recent Labs Lab 09/29/14 1144 09/29/14 1215  WBC 8.6  --   NEUTROABS 5.7  --   HGB 13.7 14.3  HCT 40.9 42.0  MCV 83.6  --   PLT 297  --     Cardiac Enzymes: No results for input(s): CKTOTAL, CKMB, CKMBINDEX, TROPONINI in the last 168 hours.  BNP: Invalid input(s): POCBNP  CBG: No results for input(s): GLUCAP in the last 168 hours.  Microbiology: Results for orders placed or performed during the hospital encounter of 05/27/11  Urine culture     Status: None   Collection Time: 05/27/11  9:16 AM  Result Value Ref Range Status   Specimen Description URINE, RANDOM  Final   Special Requests NONE  Final   Culture  Setup Time [id number]  Final   Colony Count >=100,000 COLONIES/ML  Final   Culture ESCHERICHIA COLI  Final   Report Status 05/29/2011 FINAL  Final   Organism ID, Bacteria ESCHERICHIA COLI  Final      Susceptibility   Escherichia coli - MIC*    AMPICILLIN >=32 Resistant     CEFAZOLIN <=4 Sensitive     CEFTRIAXONE <=1 Sensitive     CIPROFLOXACIN <=0.25 Sensitive     GENTAMICIN >=16 Resistant     LEVOFLOXACIN 1 Sensitive     NITROFURANTOIN <=16 Sensitive     TOBRAMYCIN 8 Intermediate     TRIMETH/SULFA <=20 Sensitive     * ESCHERICHIA COLI    Coagulation Studies:  Recent Labs  09/29/14 1144  LABPROT 13.2  INR 0.99    Urinalysis: No results for input(s): COLORURINE, LABSPEC, PHURINE, GLUCOSEU, HGBUR, BILIRUBINUR, KETONESUR, PROTEINUR, UROBILINOGEN, NITRITE, LEUKOCYTESUR in the last 168 hours.  Invalid input(s): APPERANCEUR  Lipid Panel:  No results found for: CHOL, TRIG, HDL, CHOLHDL, VLDL, LDLCALC  HgbA1C: No results found for: HGBA1C  Urine Drug Screen:  No results found for: LABOPIA, COCAINSCRNUR, LABBENZ, AMPHETMU, THCU, LABBARB  Alcohol Level: No results for input(s): ETH in the last 168 hours.    Imaging: Ct Head Wo Contrast  09/29/2014   CLINICAL DATA:  Blurred vision, headache.  EXAM: CT HEAD WITHOUT  CONTRAST  TECHNIQUE: Contiguous axial images were obtained from the base of the skull through the vertex without intravenous contrast.  COMPARISON:  None.  FINDINGS: Bony calvarium appears intact. No mass effect or midline shift is noted. Ventricular size is within normal limits. There is no evidence of mass lesion, hemorrhage or acute infarction.  IMPRESSION: Normal head CT. Critical Value/emergent results were called by telephone at the time of interpretation on 09/29/2014 at 12:10 pm to Dr. Hosie Poisson, who verbally acknowledged these results.   Electronically Signed   By: Lupita Raider, M.D.   On: 09/29/2014 12:11    Assessment: 44 y.o. female with unremarkable past medical history presenting with acute onset right visual field cut, headache and word finding difficulty. Symptoms have improved but exam pertinent for continued right inferior  quadrant field cut and decreased sensation on right side. Code stroke activated but tPA held due to mildness of symptoms. No stroke risk factors. Differential includes complex migraine vs CVA/TIA.   Plan: 1)MRI brain without contrast 2)Migraine cocktail reglan and benadryl x 1 3)If MRI negative no further neurological workup indicated  Elspeth Cho, DO [facility] [phone]  If 7pm- 7am, please page neurology on call as listed in AMION. 09/29/2014, 12:19 PM

## 2014-09-29 NOTE — ED Notes (Signed)
Pt in MRI.

## 2014-09-29 NOTE — ED Notes (Signed)
Pt was just d/c. Seen for aphasia and visual disturbances. EDP wanted pt to have MRI but pt sts she became too nervous to have it completed. Pt now wants to come back in to have MRI. These sx started at 0810 today.

## 2014-09-29 NOTE — ED Notes (Signed)
Pt. Returns from MRI. States, "blurred vision and aphagia resolved"

## 2014-09-29 NOTE — ED Notes (Signed)
EDP at bedside  

## 2014-09-29 NOTE — ED Notes (Signed)
Patient transported to MRI 

## 2014-11-30 ENCOUNTER — Telehealth: Payer: Self-pay | Admitting: Family Medicine

## 2014-11-30 NOTE — Telephone Encounter (Signed)
Last OV 07/2013.  Needs OV before we can RF med.

## 2014-11-30 NOTE — Telephone Encounter (Signed)
[facility]   Patient is calling to get refill on her valtrex if possible  [phone]

## 2015-01-09 ENCOUNTER — Encounter: Payer: Self-pay | Admitting: Family Medicine

## 2015-01-09 ENCOUNTER — Ambulatory Visit (INDEPENDENT_AMBULATORY_CARE_PROVIDER_SITE_OTHER): Payer: 59 | Admitting: Family Medicine

## 2015-01-09 VITALS — BP 120/62 | HR 68 | Temp 99.2°F | Resp 16 | Ht 65.0 in | Wt 127.0 lb

## 2015-01-09 DIAGNOSIS — B009 Herpesviral infection, unspecified: Secondary | ICD-10-CM | POA: Diagnosis not present

## 2015-01-09 DIAGNOSIS — J320 Chronic maxillary sinusitis: Secondary | ICD-10-CM

## 2015-01-09 DIAGNOSIS — Z720 Tobacco use: Secondary | ICD-10-CM

## 2015-01-09 DIAGNOSIS — Z0000 Encounter for general adult medical examination without abnormal findings: Secondary | ICD-10-CM | POA: Diagnosis not present

## 2015-01-09 DIAGNOSIS — Z23 Encounter for immunization: Secondary | ICD-10-CM

## 2015-01-09 DIAGNOSIS — F17200 Nicotine dependence, unspecified, uncomplicated: Secondary | ICD-10-CM

## 2015-01-09 MED ORDER — NORGESTIMATE-ETH ESTRADIOL 0.25-35 MG-MCG PO TABS: 1.0000 | ORAL_TABLET | Freq: Every day | ORAL | Status: DC

## 2015-01-09 MED ORDER — LEVOFLOXACIN 500 MG PO TABS: 500.0000 mg | ORAL_TABLET | Freq: Every day | ORAL | Status: DC

## 2015-01-09 MED ORDER — LEVOCETIRIZINE DIHYDROCHLORIDE 5 MG PO TABS: 5.0000 mg | ORAL_TABLET | Freq: Every evening | ORAL | Status: DC

## 2015-01-09 MED ORDER — VALACYCLOVIR HCL 500 MG PO TABS: ORAL_TABLET | ORAL | Status: DC

## 2015-01-09 NOTE — Patient Instructions (Signed)
Schedule with GYN Return for fasting labs TDAP  Medications refilled Antibiotics and new sinus medication Call when you are ready to go to ENT F/U 1 year or as needed

## 2015-01-09 NOTE — Assessment & Plan Note (Signed)
Start suppression therapy 500mg  daily

## 2015-01-09 NOTE — Assessment & Plan Note (Signed)
counsled on cessation 

## 2015-01-09 NOTE — Assessment & Plan Note (Signed)
Treat with levaquin for 1 week, would benefit from nasal sprays but does not tolerate, needs ENT she will see when she can schedule appt Trial of xyzal

## 2015-01-09 NOTE — Progress Notes (Signed)
Patient ID: Jocelyn BostonKaren A [name], female   DOB: 1971-03-31, 44 y.o.   MRN: [id number]   Subjective:    Patient ID: Jocelyn BostonKaren A [name], female    DOB: 1971-03-31, 44 y.o.   MRN: [id number]  Patient presents for CPE - no PAP  Pt here for CPE, lost insurance therefore did not come in before. She has appt with her GYN Dr. Ellyn HackBovard, where Mammogram will be done. 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 Unable to tolerate nasal sprays, has used OTC zyrtec, allegra, claritin. Continues to smoke    Review Of Systems:  GEN- denies fatigue, fever, weight loss,weakness, recent illness HEENT- denies eye drainage, change in vision, +nasal discharge, CVS- denies chest pain, palpitations RESP- denies SOB, cough, wheeze ABD- denies N/V, change in stools, abd pain GU- denies dysuria, hematuria, dribbling, incontinence MSK- denies joint pain, muscle aches, injury Neuro- denies headache, dizziness, syncope, seizure activity       Objective:    BP 120/62 mmHg  Pulse 68  Temp(Src) 99.2 F (37.3 C) (Oral)  Resp 16  Ht 5\' 5"  (1.651 m)  Wt 127 lb (57.607 kg)  BMI 21.13 kg/m2 GEN- NAD, alert and oriented x3 HEENT- PERRL, EOMI, non injected sclera, pink conjunctiva, MMM, oropharynx clear, nares, enlarged turbinates L >R, +ttp left maxillary sinus Neck- Supple, no thyromegaly, no LAD CVS- RRR, no murmur RESP-CTAB ABD-NABS,soft,NT,ND EXT- No edema Pulses- Radial, DP- 2+        Assessment & Plan:      Problem List Items Addressed This Visit    Tobacco use disorder   HSV-2 infection   Relevant Medications   valACYclovir (VALTREX) 500 MG tablet   Chronic sinusitis   Relevant Medications   levocetirizine (XYZAL) 5 MG tablet   valACYclovir (VALTREX) 500 MG tablet   levofloxacin (LEVAQUIN) 500 MG tablet    Other Visit Diagnoses    Routine general medical  examination at a health care facility    -  Primary    CPE done, TDAP given, fasting labs in AM       Note: This dictation was prepared with Dragon dictation along with smaller phrase technology. Any transcriptional errors that result from this process are unintentional.

## 2015-02-16 ENCOUNTER — Ambulatory Visit: Payer: 59 | Admitting: Physician Assistant

## 2015-02-16 ENCOUNTER — Ambulatory Visit (INDEPENDENT_AMBULATORY_CARE_PROVIDER_SITE_OTHER): Payer: 59 | Admitting: Physician Assistant

## 2015-02-16 ENCOUNTER — Encounter: Payer: Self-pay | Admitting: Physician Assistant

## 2015-02-16 VITALS — BP 100/60 | HR 74 | Temp 98.5°F | Resp 16 | Ht 65.0 in | Wt 123.0 lb

## 2015-02-16 DIAGNOSIS — L03115 Cellulitis of right lower limb: Secondary | ICD-10-CM | POA: Diagnosis not present

## 2015-02-16 DIAGNOSIS — W57XXXA Bitten or stung by nonvenomous insect and other nonvenomous arthropods, initial encounter: Secondary | ICD-10-CM | POA: Diagnosis not present

## 2015-02-16 DIAGNOSIS — Z20818 Contact with and (suspected) exposure to other bacterial communicable diseases: Secondary | ICD-10-CM

## 2015-02-16 DIAGNOSIS — Z2089 Contact with and (suspected) exposure to other communicable diseases: Secondary | ICD-10-CM

## 2015-02-16 DIAGNOSIS — T148 Other injury of unspecified body region: Secondary | ICD-10-CM | POA: Diagnosis not present

## 2015-02-16 MED ORDER — SULFAMETHOXAZOLE-TRIMETHOPRIM 800-160 MG PO TABS: 1.0000 | ORAL_TABLET | Freq: Two times a day (BID) | ORAL | Status: DC

## 2015-02-16 MED ORDER — DOXYCYCLINE HYCLATE 100 MG PO TABS: 100.0000 mg | ORAL_TABLET | Freq: Two times a day (BID) | ORAL | Status: DC

## 2015-02-16 NOTE — Progress Notes (Signed)
Patient ID: [name] MRN: [id number], DOB: 08/18/1971, 44 y.o. Date of Encounter: 02/16/2015, 1:24 PM    Chief Complaint:  Chief Complaint  Patient presents with  . Tick Bite     HPI: 44 y.o. year old white female states that this past either Friday or Saturday she had found a tick on her right lower leg just posterior to her right lateral malleolus. The dates of this past Friday and Saturday were 02/10/15 and 02/11/15. She states that yesterday that area was itching. This morning she noticed "rash".  She says that in May her son had severe case of MRSA. Says that his "rash" started out looking exactly like this. Says that he ended up having to be hospitalized for multiple days and had to receive multiple antibiotics and IV antibiotics and was seen by infectious disease.  She has had no other areas of rash. Has had no malaise or myalgias headache. No abdominal pain nausea vomiting diarrhea. No fevers no chills.     Home Meds:   Outpatient Prescriptions Prior to Visit  Medication Sig Dispense Refill  . ibuprofen (ADVIL,MOTRIN) 200 MG tablet Take 600 mg by mouth every 6 (six) hours as needed.    . norgestimate-ethinyl estradiol (SPRINTEC 28) 0.25-35 MG-MCG tablet Take 1 tablet by mouth daily. 1 Package 3  . valACYclovir (VALTREX) 500 MG tablet TAKE 1 TABLET (500 MG TOTAL) BY MOUTH 2 (TWO) TIMES DAILY. 120 tablet 1  . levocetirizine (XYZAL) 5 MG tablet Take 1 tablet (5 mg total) by mouth every evening. 30 tablet 6  . levofloxacin (LEVAQUIN) 500 MG tablet Take 1 tablet (500 mg total) by mouth daily. 7 tablet 0   No facility-administered medications prior to visit.    Allergies:  Allergies  Allergen Reactions  . Penicillins Other (See Comments)    unknown      Review of Systems: See HPI for pertinent ROS. All other ROS negative.    Physical Exam: Blood pressure 100/60, pulse 74, temperature 98.5 F (36.9 C), temperature source Oral, resp. rate 16, height  (1.651  m), weight 123 lb (55.792 kg)., Body mass index is 20.47 kg/(m^2). General:  WNWD WF. Appears in no acute distress. Neck: Supple. No thyromegaly. No lymphadenopathy. Lungs: Clear bilaterally to auscultation without wheezes, rales, or rhonchi. Breathing is unlabored. Heart: Regular rhythm. No murmurs, rubs, or gallops. Msk:  Strength and tone normal for age. Extremities/Skin: Warm and dry.  Right Leg: Just posterior to lateral malleolus--there is 2mm scab c/w site of tic bite. There is approx 1 cm diameter area of pink erythema. Just posterior to this, there is an adjacent 0.75cm diameter area of pink erythema.  There is no abscess.  Neuro: Alert and oriented X 3. Moves all extremities spontaneously. Gait is normal. CNII-XII grossly in tact. Psych:  Responds to questions appropriately with a normal affect.     ASSESSMENT AND PLAN:  44 y.o. year old female with  1. Cellulitis of leg, right There is no drainage to get culture from, but I did firmly rub the site of the rash with swab that is sent for culture. I discussed with patient that Bactrim and doxycycline are 2 antibiotics used for MRSA. Told her that if this is an MRSA infection and if it is for sensitive to these antibiotics,  that it should not worsen and should be improving in 48 hours. However if she has an MRSA infection that is resistant to these antibiotics,  then the site will  continue to worsen. If this is the case, she needs to seek follow-up medical evaluation immediately. If site worsens at all or is not improving in 24-48 hours, then follow up immediately.  - Culture, routine-abscess - sulfamethoxazole-trimethoprim (BACTRIM DS,SEPTRA DS) 800-160 MG per tablet; Take 1 tablet by mouth 2 (two) times daily.  Dispense: 20 tablet; Refill: 0 - doxycycline (VIBRA-TABS) 100 MG tablet; Take 1 tablet (100 mg total) by mouth 2 (two) times daily.  Dispense: 20 tablet; Refill: 0  2. Exposure to MRSA - Culture, routine-abscess -  sulfamethoxazole-trimethoprim (BACTRIM DS,SEPTRA DS) 800-160 MG per tablet; Take 1 tablet by mouth 2 (two) times daily.  Dispense: 20 tablet; Refill: 0 - doxycycline (VIBRA-TABS) 100 MG tablet; Take 1 tablet (100 mg total) by mouth 2 (two) times daily.  Dispense: 20 tablet; Refill: 0  3. Tick bite - Culture, routine-abscess - sulfamethoxazole-trimethoprim (BACTRIM DS,SEPTRA DS) 800-160 MG per tablet; Take 1 tablet by mouth 2 (two) times daily.  Dispense: 20 tablet; Refill: 0 - doxycycline (VIBRA-TABS) 100 MG tablet; Take 1 tablet (100 mg total) by mouth 2 (two) times daily.  Dispense: 20 tablet; Refill: 0   Signed, [id number] Beth IolaDixon, [place], [facility] 02/16/2015 1:24 PM

## 2015-02-20 LAB — CULTURE, ROUTINE-ABSCESS
GRAM STAIN: NONE SEEN
Gram Stain: NONE SEEN
Gram Stain: NONE SEEN

## 2015-04-18 ENCOUNTER — Telehealth: Payer: Self-pay | Admitting: Family Medicine

## 2015-04-18 NOTE — Telephone Encounter (Signed)
PA for Valtrex submitted thru C[facility] case [facility]

## 2015-04-18 NOTE — Telephone Encounter (Signed)
rec'd approval for Valtrex thru 04/04/2016  [id number].  Faxed to pharmacy

## 2016-05-02 IMAGING — MR MR HEAD W/O CM
9 of 11 series · 35 of 48 positions shown · non-contrast
Comparison: CT head 09/29/2014

CLINICAL DATA: Visual disturbance.

EXAM:
MRI HEAD WITHOUT CONTRAST
TECHNIQUE: Multiplanar, multiecho pulse sequences of the brain and surrounding
structures were obtained without intravenous contrast.

[Series 2: FLAIR · sagittal · 5.0mm · 0.47mm/px · 2 of 25 slices shown (1 of 3)]
[im 1/25]
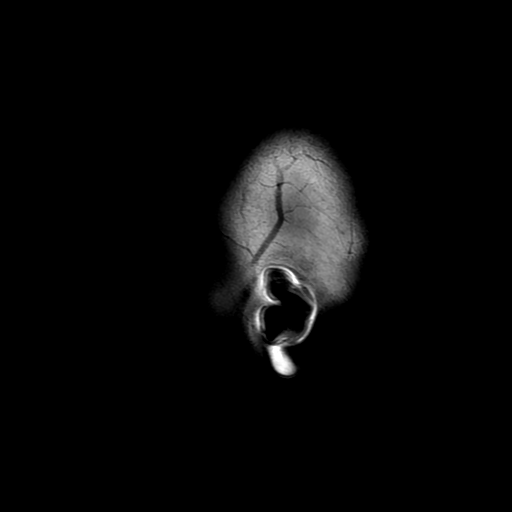
[im 25/25]
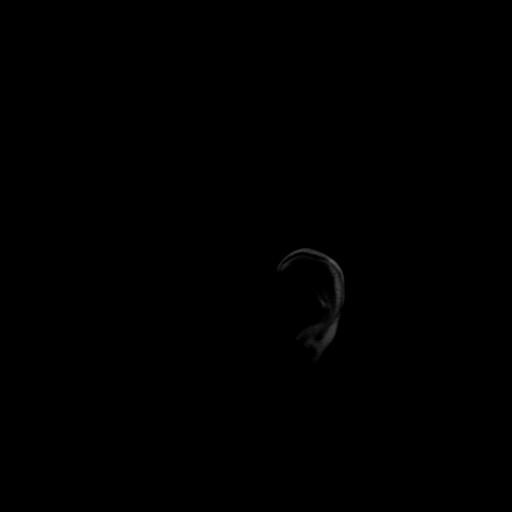

[Series 4: DWI · axial · 3.0mm · 0.94mm/px · z∈[-90,+58]mm · 8 of 106 slices shown (1 of 4)]
[im 1/106]
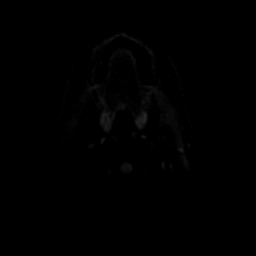
[im 12/106]
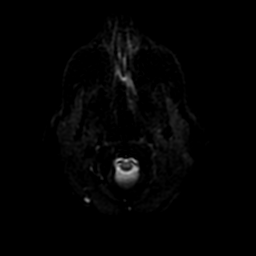
[im 36/106]
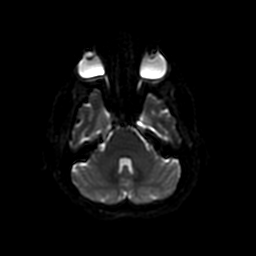
[im 47/106]
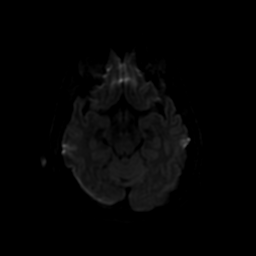
[im 59/106]
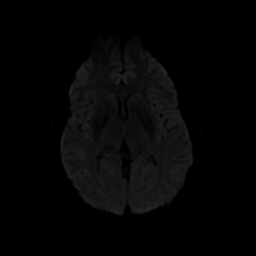
[im 71/106]
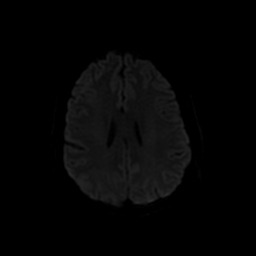
[im 94/106]
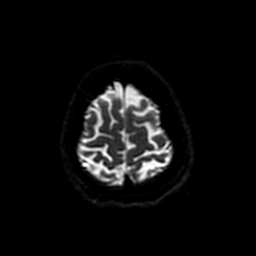
[im 106/106]
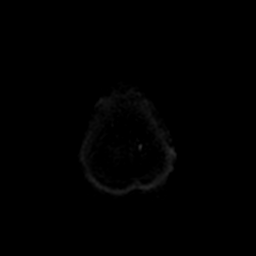

[Series 6: DWI · coronal · 5.0mm · 0.94mm/px · 6 of 66 slices shown (2 of 4)]
[im 1/66]
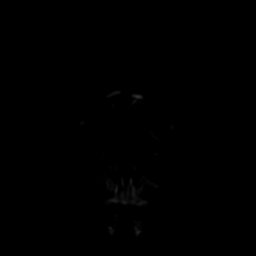
[im 14/66]
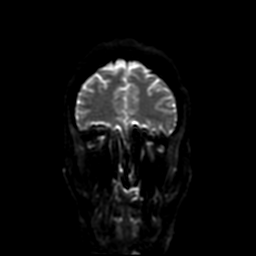
[im 27/66]
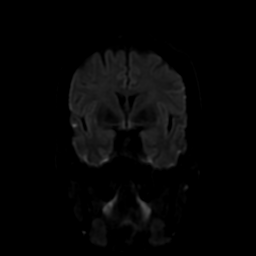
[im 40/66]
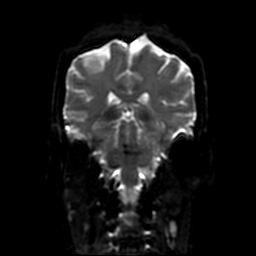
[im 53/66]
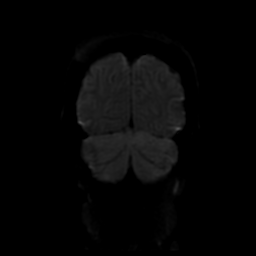
[im 66/66]
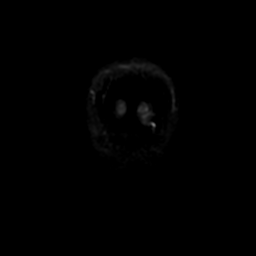

[Series 7: T2 · axial · 5.0mm · 0.47mm/px · z∈[-101,+51]mm · 3 of 28 slices shown (1 of 2)]
[im 1/28]
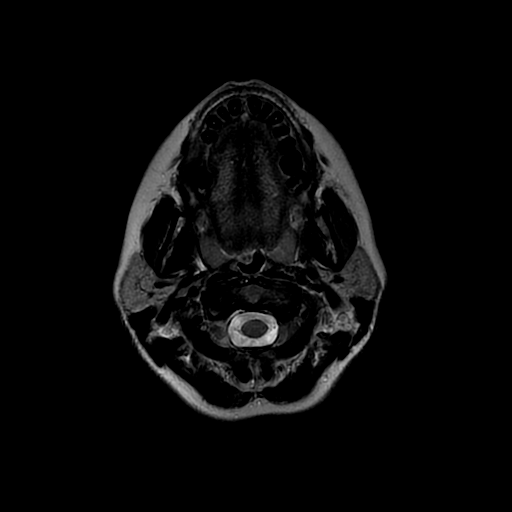
[im 14/28]
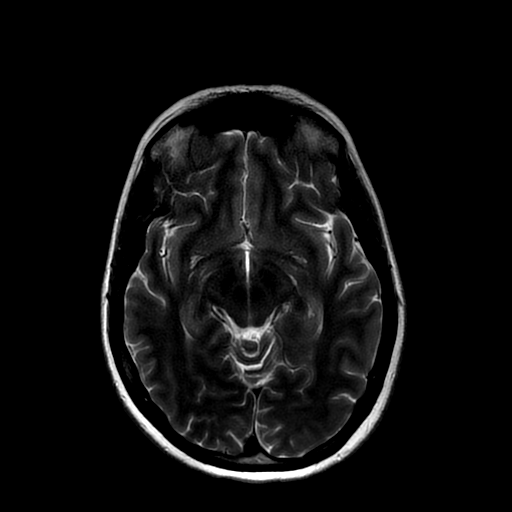
[im 28/28]
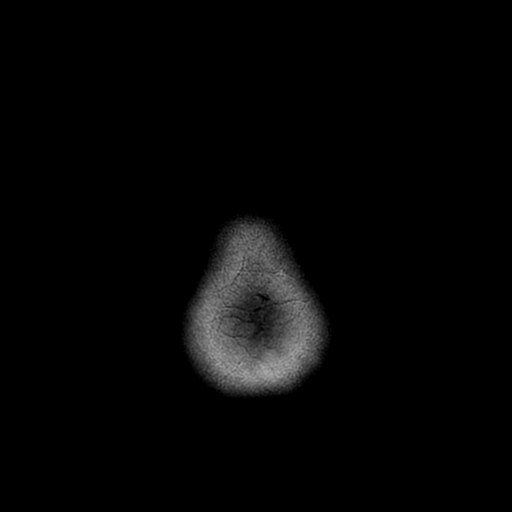

[Series 8: FLAIR · axial · 5.0mm · 0.47mm/px · z∈[-93,+44]mm · 3 of 26 slices shown (2 of 3)]
[im 1/26]
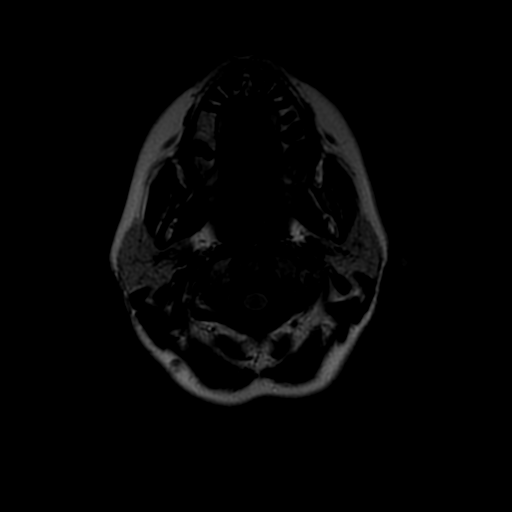
[im 13/26]
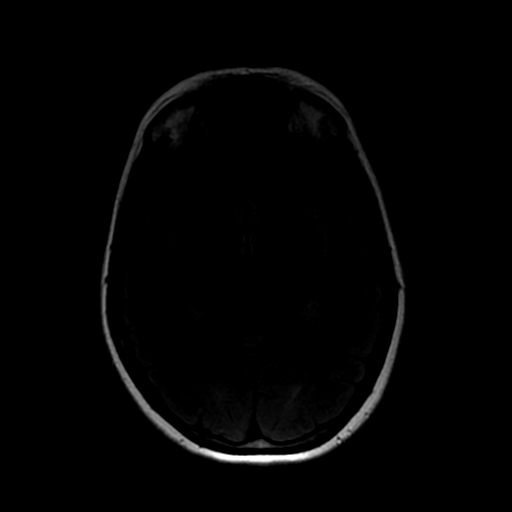
[im 26/26]
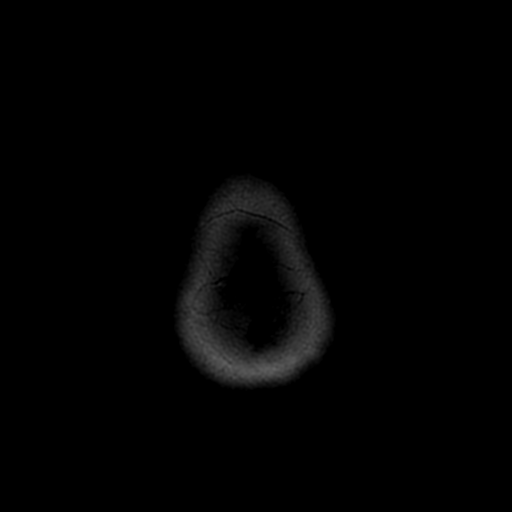

[Series 10: T2 · coronal · 5.0mm · 0.47mm/px · 2 of 28 slices shown (2 of 2)]
[im 1/28]
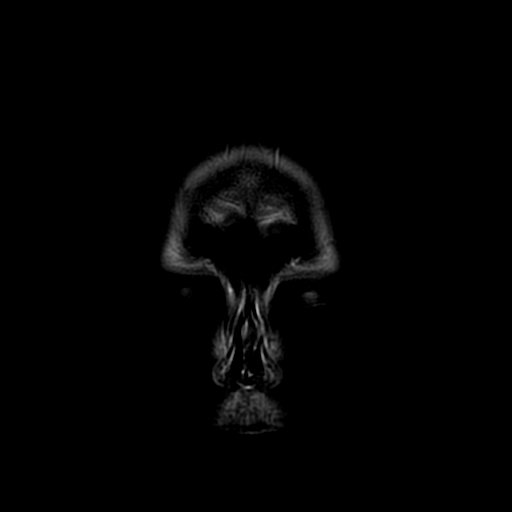
[im 14/28]
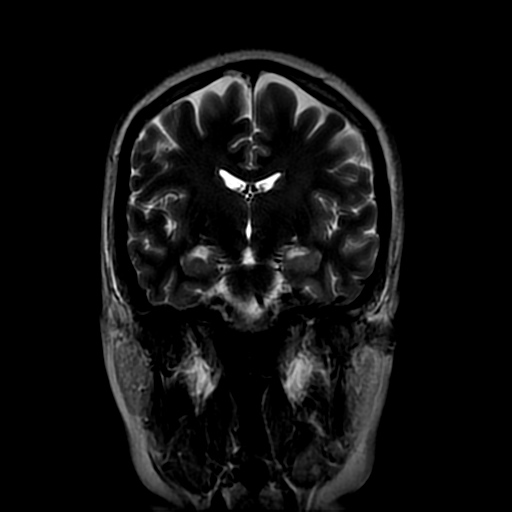

[Series 11: FLAIR · axial · 5.0mm · 0.43mm/px · z∈[-90,+57]mm · 3 of 27 slices shown (3 of 3)]
[im 1/27]
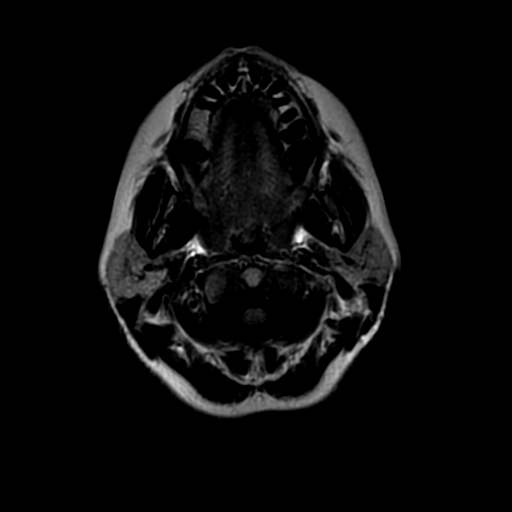
[im 14/27]
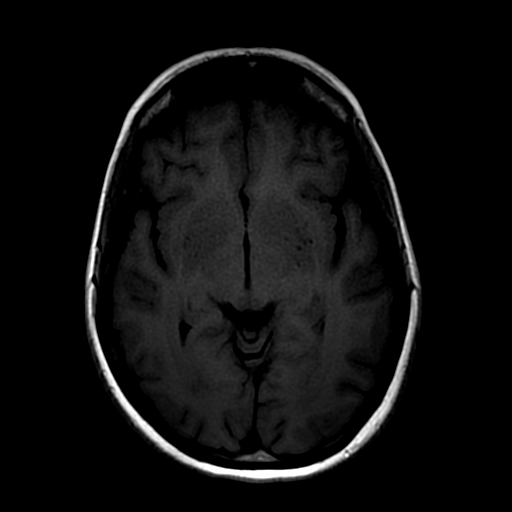
[im 27/27]
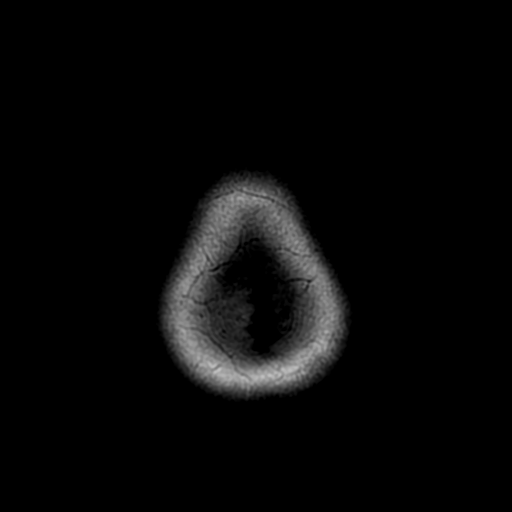

[Series 400: DWI · axial · 3.0mm · 0.94mm/px · z∈[-90,+58]mm · 5 of 53 slices shown (3 of 4)]
[im 1/53]
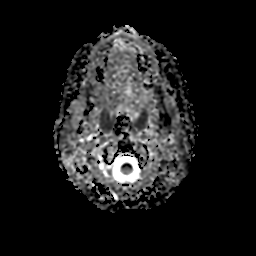
[im 14/53]
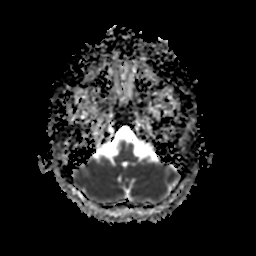
[im 27/53]
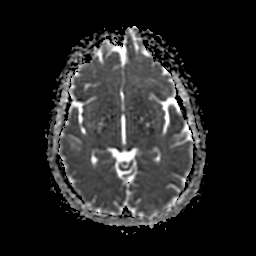
[im 40/53]
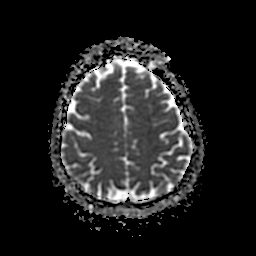
[im 53/53]
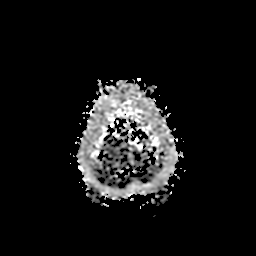

[Series 600: DWI · coronal · 5.0mm · 0.94mm/px · 3 of 33 slices shown (4 of 4)]
[im 1/33]
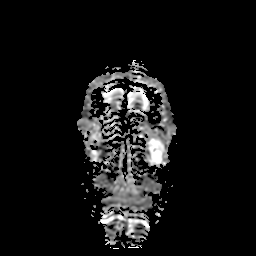
[im 17/33]
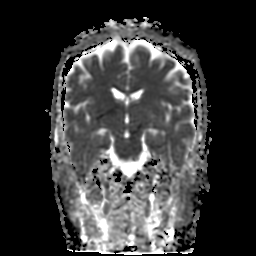
[im 33/33]
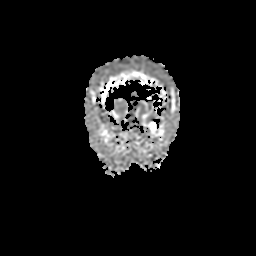

[35 of 48 positions shown; findings below may reference images not displayed]

FINDINGS: Ventricle size is normal. Negative for hydrocephalus. Cerebral
volume is normal. Pituitary normal in size. Craniocervical junction
is normal.

Negative for acute or chronic infarction.

Negative for demyelinating disease.

Negative for hemorrhage or fluid collection

Negative for mass or edema.

Minimal mucosal edema in the ethmoid sinuses.
IMPRESSION: Normal MRI of the brain without contrast.

## 2016-11-27 ENCOUNTER — Encounter: Payer: Self-pay | Admitting: Family Medicine

## 2017-02-24 ENCOUNTER — Encounter: Payer: Self-pay | Admitting: Family Medicine

## 2017-04-16 ENCOUNTER — Encounter: Payer: Self-pay | Admitting: Family Medicine

## 2018-02-09 ENCOUNTER — Other Ambulatory Visit: Payer: Self-pay | Admitting: Family Medicine

## 2018-02-09 DIAGNOSIS — R42 Dizziness and giddiness: Secondary | ICD-10-CM

## 2018-02-09 DIAGNOSIS — R51 Headache: Principal | ICD-10-CM

## 2018-02-09 DIAGNOSIS — R519 Headache, unspecified: Secondary | ICD-10-CM

## 2018-02-09 DIAGNOSIS — M79601 Pain in right arm: Secondary | ICD-10-CM

## 2018-02-12 ENCOUNTER — Ambulatory Visit
Admission: RE | Admit: 2018-02-12 | Discharge: 2018-02-12 | Disposition: A | Discharge status: Home / Self Care | Payer: BLUE CROSS/BLUE SHIELD | Source: Ambulatory Visit | Attending: Family Medicine | Admitting: Family Medicine

## 2018-02-12 DIAGNOSIS — R519 Headache, unspecified: Secondary | ICD-10-CM

## 2018-02-12 DIAGNOSIS — M79601 Pain in right arm: Secondary | ICD-10-CM

## 2018-02-12 DIAGNOSIS — R42 Dizziness and giddiness: Secondary | ICD-10-CM

## 2018-02-12 DIAGNOSIS — R51 Headache: Principal | ICD-10-CM

## 2018-02-12 MED ORDER — GADOBENATE DIMEGLUMINE 529 MG/ML IV SOLN
10.0000 mL | Freq: Once | INTRAVENOUS | Status: AC | PRN
  Administered 2018-02-12: 10 mL via INTRAVENOUS

## 2018-10-10 ENCOUNTER — Emergency Department (HOSPITAL_COMMUNITY)
Admission: EM | Admit: 2018-10-10 | Discharge: 2018-10-10 | Disposition: A | Discharge status: Home / Self Care | Payer: BLUE CROSS/BLUE SHIELD | Attending: Emergency Medicine | Admitting: Emergency Medicine

## 2018-10-10 ENCOUNTER — Encounter (HOSPITAL_COMMUNITY): Payer: Self-pay

## 2018-10-10 ENCOUNTER — Emergency Department (HOSPITAL_COMMUNITY): Payer: BLUE CROSS/BLUE SHIELD

## 2018-10-10 ENCOUNTER — Other Ambulatory Visit: Payer: Self-pay

## 2018-10-10 DIAGNOSIS — N201 Calculus of ureter: Secondary | ICD-10-CM | POA: Insufficient documentation

## 2018-10-10 DIAGNOSIS — R1110 Vomiting, unspecified: Secondary | ICD-10-CM | POA: Diagnosis not present

## 2018-10-10 DIAGNOSIS — F17210 Nicotine dependence, cigarettes, uncomplicated: Secondary | ICD-10-CM | POA: Diagnosis not present

## 2018-10-10 DIAGNOSIS — R1030 Lower abdominal pain, unspecified: Secondary | ICD-10-CM | POA: Diagnosis not present

## 2018-10-10 DIAGNOSIS — R1032 Left lower quadrant pain: Secondary | ICD-10-CM | POA: Diagnosis present

## 2018-10-10 LAB — CBC WITH DIFFERENTIAL/PLATELET
Abs Immature Granulocytes: 0.08 10*3/uL — ABNORMAL HIGH (ref 0.00–0.07)
BASOS ABS: 0.1 10*3/uL (ref 0.0–0.1)
Basophils Relative: 1 %
EOS PCT: 2 %
Eosinophils Absolute: 0.2 10*3/uL (ref 0.0–0.5)
HCT: 43.4 % (ref 36.0–46.0)
Hemoglobin: 14 g/dL (ref 12.0–15.0)
Immature Granulocytes: 1 %
LYMPHS ABS: 2.4 10*3/uL (ref 0.7–4.0)
Lymphocytes Relative: 19 %
MCH: 28.3 pg (ref 26.0–34.0)
MCHC: 32.3 g/dL (ref 30.0–36.0)
MCV: 87.9 fL (ref 80.0–100.0)
Monocytes Absolute: 1 10*3/uL (ref 0.1–1.0)
Monocytes Relative: 8 %
NEUTROS PCT: 69 %
NRBC: 0 % (ref 0.0–0.2)
Neutro Abs: 9 10*3/uL — ABNORMAL HIGH (ref 1.7–7.7)
PLATELETS: 308 10*3/uL (ref 150–400)
RBC: 4.94 MIL/uL (ref 3.87–5.11)
RDW: 13.1 % (ref 11.5–15.5)
WBC: 12.7 10*3/uL — ABNORMAL HIGH (ref 4.0–10.5)

## 2018-10-10 LAB — BASIC METABOLIC PANEL
Anion gap: 7 (ref 5–15)
BUN: 17 mg/dL (ref 6–20)
CALCIUM: 9.1 mg/dL (ref 8.9–10.3)
CO2: 23 mmol/L (ref 22–32)
Chloride: 109 mmol/L (ref 98–111)
Creatinine, Ser: 0.96 mg/dL (ref 0.44–1.00)
Glucose, Bld: 124 mg/dL — ABNORMAL HIGH (ref 70–99)
Potassium: 4 mmol/L (ref 3.5–5.1)
SODIUM: 139 mmol/L (ref 135–145)

## 2018-10-10 LAB — I-STAT BETA HCG BLOOD, ED (MC, WL, AP ONLY): I-stat hCG, quantitative: <5 m[IU]/mL (ref <5)

## 2018-10-10 LAB — URINALYSIS, ROUTINE W REFLEX MICROSCOPIC
Bacteria, UA: NONE SEEN
Bilirubin Urine: NEGATIVE
GLUCOSE, UA: NEGATIVE mg/dL
Ketones, ur: NEGATIVE mg/dL
LEUKOCYTE UA: NEGATIVE
Nitrite: NEGATIVE
PH: 5 (ref 5.0–8.0)
Protein, ur: 100 mg/dL — AB
RBC / HPF: >50 RBC/hpf — ABNORMAL HIGH (ref 0–5)
Specific Gravity, Urine: 1.026 (ref 1.005–1.030)

## 2018-10-10 MED ORDER — KETOROLAC TROMETHAMINE 30 MG/ML IJ SOLN
30.0000 mg | Freq: Once | INTRAMUSCULAR | Status: AC
  Administered 2018-10-10: 30 mg via INTRAVENOUS
  Filled 2018-10-10: qty 1

## 2018-10-10 MED ORDER — SODIUM CHLORIDE 0.9 % IV BOLUS
1000.0000 mL | Freq: Once | INTRAVENOUS | Status: AC
  Administered 2018-10-10: 1000 mL via INTRAVENOUS

## 2018-10-10 MED ORDER — HYDROCODONE-ACETAMINOPHEN 5-325 MG PO TABS: 1.0000 | ORAL_TABLET | Freq: Four times a day (QID) | ORAL | 0 refills | Status: DC | PRN

## 2018-10-10 MED ORDER — HYDROMORPHONE HCL 1 MG/ML IJ SOLN: 0.5000 mg | Freq: Once | INTRAMUSCULAR | Status: DC

## 2018-10-10 MED ORDER — ONDANSETRON HCL 4 MG/2ML IJ SOLN
4.0000 mg | Freq: Once | INTRAMUSCULAR | Status: AC
  Administered 2018-10-10: 4 mg via INTRAVENOUS
  Filled 2018-10-10: qty 2

## 2018-10-10 MED ORDER — FENTANYL CITRATE (PF) 100 MCG/2ML IJ SOLN
50.0000 ug | Freq: Once | INTRAMUSCULAR | Status: AC
  Administered 2018-10-10: 50 ug via INTRAVENOUS
  Filled 2018-10-10: qty 2

## 2018-10-10 MED ORDER — HYDROMORPHONE HCL 1 MG/ML IJ SOLN
1.0000 mg | Freq: Once | INTRAMUSCULAR | Status: AC
  Administered 2018-10-10: 1 mg via INTRAVENOUS
  Filled 2018-10-10: qty 1

## 2018-10-10 MED ORDER — IBUPROFEN 600 MG PO TABS: 600.0000 mg | ORAL_TABLET | Freq: Four times a day (QID) | ORAL | 0 refills | Status: AC | PRN

## 2018-10-10 MED ORDER — TAMSULOSIN HCL 0.4 MG PO CAPS: 0.4000 mg | ORAL_CAPSULE | Freq: Every day | ORAL | 0 refills | Status: DC

## 2018-10-10 MED ORDER — ONDANSETRON 4 MG PO TBDP: 4.0000 mg | ORAL_TABLET | Freq: Three times a day (TID) | ORAL | 0 refills | Status: AC | PRN

## 2018-10-10 MED ORDER — HYDROCODONE-ACETAMINOPHEN 5-325 MG PO TABS
2.0000 | ORAL_TABLET | Freq: Once | ORAL | Status: AC
  Administered 2018-10-10: 2 via ORAL
  Filled 2018-10-10: qty 2

## 2018-10-10 NOTE — Discharge Instructions (Signed)
Take Flomax as prescribed to promote stone movement.  You have been prescribed Vicodin/Norco to take for management of pain.  This can be taken with 600 mg ibuprofen every 6 hours.  Do not drive or drink alcohol after taking Norco as this may make you drowsy and impact your judgment.  Use Zofran as needed for management of nausea and vomiting.  Follow up with Urology to ensure resolution of symptoms.

## 2018-10-10 NOTE — ED Triage Notes (Signed)
Pt reports L sided flank pain that goes around to her groin. She reports that she "can only pee a trickle." Obvious pain noted.

## 2018-10-10 NOTE — ED Provider Notes (Signed)
[facility]-EMERGENCY DEPT Provider Note   CSN: [id number] Arrival date & time: 10/10/18  0116    History   Chief Complaint Chief Complaint  Patient presents with  . Flank Pain    HPI [name] is a 48 y.o. female.    48 year old female with no significant past medical history presents to the emergency department for evaluation of left-sided flank pain.  Symptoms woke her from sleep around midnight.  They have remained constant and waxing and waning in severity.  She has had approximately 4 episodes of nonbloody, nonbilious emesis associated with worsening pain.  Notes some radiation of her pain from her left flank around to her left lower quadrant and suprapubic abdomen.  Was in normal state of health prior to bed.  Has not had any preceding fevers, dysuria, hematuria, vaginal complaints.  Abdominal surgical history significant for partial hysterectomy.  No personal or family history of kidney stones.  The history is provided by the patient. No language interpreter was used.  Flank Pain     Past Medical History:  Diagnosis Date  . Allergy   . HSV-2 infection     Patient Active Problem List   Diagnosis Date Noted  . Visual field cut   . Cephalalgia   . Chronic sinusitis 07/26/2013  . HSV-2 infection 07/26/2013  . S/P hysterectomy 07/26/2013  . Tobacco use disorder 07/26/2013    Past Surgical History:  Procedure Laterality Date  . ABDOMINAL HYSTERECTOMY     48yo     OB History   No obstetric history on file.      Home Medications    Prior to Admission medications   Medication Sig Start Date End Date Taking? Authorizing Provider  doxycycline (VIBRA-TABS) 100 MG tablet Take 1 tablet (100 mg total) by mouth 2 (two) times daily. Patient not taking: Reported on 10/10/2018 02/16/15   Dorena Bodo, PA-C  HYDROcodone-acetaminophen (NORCO/VICODIN) 5-325 MG tablet Take 1-2 tablets by mouth every 6 (six) hours as needed for severe pain. 10/10/18    Antony Madura, PA-C  ibuprofen (ADVIL,MOTRIN) 600 MG tablet Take 1 tablet (600 mg total) by mouth every 6 (six) hours as needed for mild pain or moderate pain. 10/10/18   Antony Madura, PA-C  norgestimate-ethinyl estradiol (SPRINTEC 28) 0.25-35 MG-MCG tablet Take 1 tablet by mouth daily. Patient not taking: Reported on 10/10/2018 01/09/15   Salley Scarlet, MD  ondansetron (ZOFRAN ODT) 4 MG disintegrating tablet Take 1 tablet (4 mg total) by mouth every 8 (eight) hours as needed for nausea or vomiting. 10/10/18   Antony Madura, PA-C  sulfamethoxazole-trimethoprim (BACTRIM DS,SEPTRA DS) 800-160 MG per tablet Take 1 tablet by mouth 2 (two) times daily. Patient not taking: Reported on 10/10/2018 02/16/15   Allayne Butcher B, PA-C  tamsulosin (FLOMAX) 0.4 MG CAPS capsule Take 1 capsule (0.4 mg total) by mouth daily. 10/10/18   Antony Madura, PA-C  valACYclovir (VALTREX) 500 MG tablet TAKE 1 TABLET (500 MG TOTAL) BY MOUTH 2 (TWO) TIMES DAILY. Patient not taking: Reported on 10/10/2018 01/09/15   Salley Scarlet, MD    Family History Family History  Problem Relation Age of Onset  . Learning disabilities Son   . Cancer Maternal Grandmother     Social History Social History   Tobacco Use  . Smoking status: Current Every Day Smoker    Packs/day: 0.50  Substance Use Topics  . Alcohol use: Yes  . Drug use: No     Allergies  Penicillins   Review of Systems Review of Systems  Genitourinary: Positive for flank pain.  Ten systems reviewed and are negative for acute change, except as noted in the HPI.    Physical Exam Updated Vital Signs BP 138/89   Pulse 63   Temp 98.5 F (36.9 C) (Oral)   Resp 15   SpO2 94%   Physical Exam Vitals signs and nursing note reviewed.  Constitutional:      General: She is not in acute distress.    Appearance: She is well-developed. She is not diaphoretic.     Comments: Nontoxic and in NAD. Appears uncomfortable.  HENT:     Head: Normocephalic and atraumatic.   Eyes:     General: No scleral icterus.    Conjunctiva/sclera: Conjunctivae normal.  Neck:     Musculoskeletal: Normal range of motion.  Cardiovascular:     Rate and Rhythm: Normal rate and regular rhythm.     Pulses: Normal pulses.  Pulmonary:     Effort: Pulmonary effort is normal. No respiratory distress.     Breath sounds: No stridor. No wheezing.     Comments: Respirations even and unlabored Abdominal:     Palpations: Abdomen is soft.     Tenderness: There is abdominal tenderness. There is left CVA tenderness.  Musculoskeletal: Normal range of motion.  Skin:    General: Skin is warm and dry.     Coloration: Skin is not pale.     Findings: No erythema or rash.  Neurological:     Mental Status: She is alert and oriented to person, place, and time.     Coordination: Coordination normal.     Comments: Moving all extremities spontaneously.  Psychiatric:        Behavior: Behavior normal.      ED Treatments / Results  Labs (all labs ordered are listed, but only abnormal results are displayed) Labs Reviewed  URINALYSIS, ROUTINE W REFLEX MICROSCOPIC - Abnormal; Notable for the following components:      Result Value   Color, Urine AMBER (*)    APPearance CLOUDY (*)    Hgb urine dipstick LARGE (*)    Protein, ur 100 (*)    RBC / HPF >50 (*)    All other components within normal limits  CBC WITH DIFFERENTIAL/PLATELET - Abnormal; Notable for the following components:   WBC 12.7 (*)    Neutro Abs 9.0 (*)    Abs Immature Granulocytes 0.08 (*)    All other components within normal limits  BASIC METABOLIC PANEL - Abnormal; Notable for the following components:   Glucose, Bld 124 (*)    All other components within normal limits  I-STAT BETA HCG BLOOD, ED ([facility], [facility], [facility] ONLY)    EKG None  Radiology Ct Renal Stone Study  Result Date: 10/10/2018 CLINICAL DATA:  48 year old female with left flank pain. Concern for renal calculus. EXAM: CT ABDOMEN AND PELVIS WITHOUT CONTRAST  TECHNIQUE: Multidetector CT imaging of the abdomen and pelvis was performed following the standard protocol without IV contrast. COMPARISON:  None. FINDINGS: Evaluation of this exam is limited in the absence of intravenous contrast. Lower chest: Minimal bibasilar atelectatic changes. The visualized lung bases are otherwise clear. No intra-abdominal free air or free fluid. Hepatobiliary: No focal liver abnormality is seen. No gallstones, gallbladder wall thickening, or biliary dilatation. Pancreas: Unremarkable. No pancreatic ductal dilatation or surrounding inflammatory changes. Spleen: Normal in size without focal abnormality. Adrenals/Urinary Tract: The adrenal glands are unremarkable. There is a 4  mm stone at the left ureteropelvic junction with mild left hydronephrosis. A small 4 mm nonobstructing right renal calculus versus parenchymal calcification. No hydronephrosis on the right. The right ureter appears unremarkable. The urinary bladder is collapsed. Stomach/Bowel: There is no bowel obstruction or active inflammation. Normal appendix. Vascular/Lymphatic: The abdominal aorta and IVC are grossly unremarkable on this noncontrast study. No portal venous gas. There is no adenopathy. Reproductive: Hysterectomy. No pelvic masses. Other: None Musculoskeletal: Partially visualized bilateral breast implants. No acute osseous pathology. IMPRESSION: A 4 mm left UPJ stone with mild left hydronephrosis. Electronically Signed   By: Elgie Collard M.D.   On: 10/10/2018 04:02    Procedures Procedures (including critical care time)  Medications Ordered in ED Medications  HYDROcodone-acetaminophen (NORCO/VICODIN) 5-325 MG per tablet 2 tablet (has no administration in time range)  HYDROmorphone (DILAUDID) injection 0.5 mg (has no administration in time range)  ondansetron (ZOFRAN) injection 4 mg (4 mg Intravenous Given 10/10/18 0238)  fentaNYL (SUBLIMAZE) injection 50 mcg (50 mcg Intravenous Given 10/10/18 0240)    sodium chloride 0.9 % bolus 1,000 mL (1,000 mLs Intravenous New Bag/Given 10/10/18 0259)  HYDROmorphone (DILAUDID) injection 1 mg (1 mg Intravenous Given 10/10/18 0329)  ketorolac (TORADOL) 30 MG/ML injection 30 mg (30 mg Intravenous Given 10/10/18 0326)    4:53 AM Pain has improved following IV medications.  Will give 1 additional dose of IV Dilaudid.  2 Norco to be administered prior to discharge for continued pain management.  Substance database reviewed with no narcotics filled for at least 6 months.   Initial Impression / Assessment and Plan / ED Course  I have reviewed the triage vital signs and the nursing notes.  Pertinent labs & imaging results that were available during my care of the patient were reviewed by me and considered in my medical decision making (see chart for details).        Pt has been diagnosed with a kidney stone via CT. There is no evidence of significant hydronephrosis, serum creatine WNL, vitals sign stable and the pt does not have irratractable vomiting. Patient will be discharged home with pain medications & has been advised to follow up with Urology if pain persists. Return precautions discussed and provided. Patient discharged in stable condition with no unaddressed concerns.   Final Clinical Impressions(s) / ED Diagnoses   Final diagnoses:  Ureterolithiasis    ED Discharge Orders         Ordered    HYDROcodone-acetaminophen (NORCO/VICODIN) 5-325 MG tablet  Every 6 hours PRN     10/10/18 0450    tamsulosin (FLOMAX) 0.4 MG CAPS capsule  Daily     10/10/18 0450    ondansetron (ZOFRAN ODT) 4 MG disintegrating tablet  Every 8 hours PRN     10/10/18 0450    ibuprofen (ADVIL,MOTRIN) 600 MG tablet  Every 6 hours PRN     10/10/18 0450           Antony Madura, PA-C 10/10/18 0454    Palumbo, April, MD 10/10/18 [phone]

## 2018-10-13 ENCOUNTER — Other Ambulatory Visit: Payer: Self-pay | Admitting: Urology

## 2018-10-14 ENCOUNTER — Encounter (HOSPITAL_COMMUNITY): Payer: Self-pay | Admitting: General Practice

## 2018-10-19 ENCOUNTER — Ambulatory Visit (HOSPITAL_COMMUNITY): Payer: BLUE CROSS/BLUE SHIELD

## 2018-10-19 ENCOUNTER — Encounter (HOSPITAL_COMMUNITY)
Admission: RE | Disposition: A | Discharge status: Home / Self Care | Payer: Self-pay | Source: Other Acute Inpatient Hospital | Attending: Urology

## 2018-10-19 ENCOUNTER — Ambulatory Visit (HOSPITAL_COMMUNITY)
Admission: RE | Admit: 2018-10-19 | Discharge: 2018-10-19 | Disposition: A | Discharge status: Home / Self Care | Payer: BLUE CROSS/BLUE SHIELD | Source: Other Acute Inpatient Hospital | Attending: Urology | Admitting: Urology

## 2018-10-19 ENCOUNTER — Encounter (HOSPITAL_COMMUNITY): Payer: Self-pay | Admitting: General Practice

## 2018-10-19 DIAGNOSIS — N201 Calculus of ureter: Secondary | ICD-10-CM | POA: Diagnosis present

## 2018-10-19 DIAGNOSIS — Z880 Allergy status to penicillin: Secondary | ICD-10-CM | POA: Insufficient documentation

## 2018-10-19 DIAGNOSIS — F17210 Nicotine dependence, cigarettes, uncomplicated: Secondary | ICD-10-CM | POA: Insufficient documentation

## 2018-10-19 HISTORY — DX: Personal history of urinary calculi: Z87.442

## 2018-10-19 HISTORY — PX: EXTRACORPOREAL SHOCK WAVE LITHOTRIPSY: SHX1557

## 2018-10-19 SURGERY — LITHOTRIPSY, ESWL
Anesthesia: LOCAL | Laterality: Left

## 2018-10-19 MED ORDER — DIAZEPAM 5 MG PO TABS
10.0000 mg | ORAL_TABLET | ORAL | Status: AC
  Administered 2018-10-19: 10 mg via ORAL
  Filled 2018-10-19: qty 2

## 2018-10-19 MED ORDER — HYDROMORPHONE HCL 2 MG PO TABS: 2.0000 mg | ORAL_TABLET | Freq: Four times a day (QID) | ORAL | 0 refills | Status: AC | PRN

## 2018-10-19 MED ORDER — DIPHENHYDRAMINE HCL 25 MG PO CAPS
25.0000 mg | ORAL_CAPSULE | ORAL | Status: AC
  Administered 2018-10-19: 25 mg via ORAL
  Filled 2018-10-19: qty 1

## 2018-10-19 MED ORDER — SODIUM CHLORIDE 0.9 % IV SOLN
INTRAVENOUS | Status: DC
  Administered 2018-10-19: 07:00:00 via INTRAVENOUS

## 2018-10-19 MED ORDER — CIPROFLOXACIN HCL 500 MG PO TABS
500.0000 mg | ORAL_TABLET | ORAL | Status: AC
  Administered 2018-10-19: 500 mg via ORAL
  Filled 2018-10-19: qty 1

## 2018-10-19 MED ORDER — HYDROMORPHONE HCL 2 MG PO TABS: 2.0000 mg | ORAL_TABLET | Freq: Four times a day (QID) | ORAL | Status: DC | PRN

## 2018-10-19 MED ORDER — TAMSULOSIN HCL 0.4 MG PO CAPS: 0.4000 mg | ORAL_CAPSULE | Freq: Every day | ORAL | 0 refills | Status: AC

## 2018-10-19 NOTE — Discharge Instructions (Signed)

## 2018-10-19 NOTE — H&P (Signed)
CC/HPI: 1 - Left Ureteral Stone - 43mm left mid ureteral stone by ER CT 10/10/18. Stone is solitary, level up top of L4, 800HU, 35mm. UA without infectious parameters. CR <1. Given trial of medical therapy and no interval passage. Pain fair control with hydrocodone, lots of nausea. KUB 2/24 stone seen in same location.   PMH sig for partial hyst. NO ischemic CV disease / blood thinners. She works at [employer]. Her PCP is with [facility].   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     ALLERGIES: Penicillin    MEDICATIONS: Tamsulosin Hcl 0.4 mg capsule  Hydrocodone-Acetaminophen 5 mg-325 mg tablet  Ibuprofen 600 mg tablet     GU PSH: Hysterectomy    NON-GU PSH: None   GU PMH: None   NON-GU PMH: None   FAMILY HISTORY: None   SOCIAL HISTORY: Marital Status: Single Preferred Language: English; Ethnicity: Not Hispanic Or Latino; Race: White Current Smoking Status: Patient smokes. Has smoked since 09/19/2008. Smokes 1/2 pack per day.   Tobacco Use Assessment Completed: Used Tobacco in last 30 days? Has never drank.  Drinks 2 caffeinated drinks per day. Patient's occupation is/was [employer].    REVIEW OF SYSTEMS:    GU Review Female:   Patient reports get up at night to urinate. Patient denies frequent urination, hard to postpone urination, burning /pain with urination, leakage of urine, stream starts and stops, trouble starting your stream, have to strain to urinate, and being pregnant.  Gastrointestinal (Upper):   Patient reports nausea and vomiting. Patient denies indigestion/ heartburn.  Gastrointestinal (Lower):   Patient denies diarrhea and constipation.  Constitutional:   Patient reports fatigue. Patient denies fever, night sweats, and weight loss.  Skin:   Patient denies skin rash/ lesion and itching.  Eyes:   Patient denies blurred vision and double vision.  Ears/ Nose/ Throat:   Patient reports sinus  problems. Patient denies sore throat.  Hematologic/Lymphatic:   Patient denies swollen glands and easy bruising.  Cardiovascular:   Patient denies leg swelling and chest pains.  Respiratory:   Patient reports cough. Patient denies shortness of breath.  Endocrine:   Patient reports excessive thirst.   Musculoskeletal:   Patient reports back pain. Patient denies joint pain.  Neurological:   Patient reports headaches. Patient denies dizziness.  Psychologic:   Patient denies depression and anxiety.   Notes: Weak Stream    VITAL SIGNS:      10/12/2018 01:54 PM  Weight 131 lb / 59.42 kg  Height 65 in / 165.1 cm  BP 111/76 mmHg  Pulse 73 /min  Temperature 98.2 F / 36.7 C  BMI 21.8 kg/m   GU PHYSICAL EXAMINATION:    Bladder: Normal to palpation, no tenderness, no mass, normal size.   MULTI-SYSTEM PHYSICAL EXAMINATION:    Constitutional: Well-nourished. No physical deformities. Normally developed. Good grooming.  Neck: Neck symmetrical, not swollen. Normal tracheal position.  Respiratory: No labored breathing, no use of accessory muscles.   Cardiovascular: Normal temperature, normal extremity pulses, no swelling, no varicosities.  Lymphatic: No enlargement of neck, axillae, groin.  Skin: No paleness, no jaundice, no cyanosis. No lesion, no ulcer, no rash.  Neurologic / Psychiatric: Oriented to time, oriented to place, oriented to person. No depression, no anxiety, no agitation.  Gastrointestinal: No mass, no tenderness, no rigidity, non obese abdomen.  Eyes: Normal conjunctivae. Normal eyelids.  Ears, Nose, Mouth, and Throat: Left  ear no scars, no lesions, no masses. Right ear no scars, no lesions, no masses. Nose no scars, no lesions, no masses. Normal hearing. Normal lips.  Musculoskeletal: Normal gait and station of head and neck.     PAST DATA REVIEWED:  Source Of History:  Patient  Lab Test Review:   BMP  Records Review:   Previous Hospital Records, Previous Patient Records   Urine Test Review:   Urinalysis  X-Ray Review: C.T. Abdomen/Pelvis: Reviewed Films. Reviewed Report. Discussed With Patient.     PROCEDURES:         KUB - [id number]  A single view of the abdomen is obtained.      Patient confirmed No Neulasta OnPro Device.   Left Ureteral stone seen just above L4 transverse process.           Urinalysis w/Scope Dipstick Dipstick Cont'd Micro  Color: Yellow Bilirubin: Neg mg/dL WBC/hpf: 6 - 03/BCW  Appearance: Clear Ketones: Neg mg/dL RBC/hpf: 10 - 88/QBV  Specific Gravity: 1.025 Blood: 3+ ery/uL Bacteria: Few (10-25/hpf)  pH: 5.5 Protein: Neg mg/dL Cystals: NS (Not Seen)  Glucose: Neg mg/dL Urobilinogen: 0.2 mg/dL Casts: NS (Not Seen)    Nitrites: Neg Trichomonas: Not Present    Leukocyte Esterase: Neg leu/uL Mucous: Not Present      Epithelial Cells: 6 - 10/hpf      Yeast: NS (Not Seen)      Sperm: Not Present    ASSESSMENT:      ICD-10 Details  1 GU:   Renal calculus - N20.0 Left, Acute   PLAN:            Medications New Meds: Tamsulosin Hcl 0.4 mg capsule 1 capsule PO Daily to help pass kidney stone  #30  0 Refill(s)  Hydromorphone Hcl 2 mg tablet 1 tablet PO Q6 PRN kidney stone pain  #20  0 Refill(s)  Zofran 4 mg tablet 1 tablet PO Q6 PRN nausea from kidney stone  #20  1 Refill(s)            Orders X-Rays: KUB          Schedule         Document Letter(s):  Created for Patient: Clinical Summary         Notes:   We discussed management options for ureteral stone including medical therapy (>50% chance of passage as <47mm), shockwave lithotripsy (>90% chance success at size/ location), or ureteroscopy.   She opts for SWL next avail. Risks, benefits, peri-treatment course discussed. PO dilaudid, zofran, stool softeneer for interval.

## 2018-10-19 NOTE — Interval H&P Note (Signed)
History and Physical Interval Note:  10/19/2018 10:09 AM  [name]  has presented today for surgery, with the diagnosis of LEFT URETERAL STONE  The various methods of treatment have been discussed with the patient and family. After consideration of risks, benefits and other options for treatment, the patient has consented to  Procedure(s): EXTRACORPOREAL SHOCK WAVE LITHOTRIPSY (ESWL) (Left) as a surgical intervention .  The patient's history has been reviewed, patient examined, no change in status, stable for surgery.  I have reviewed the patient's chart and labs.  Questions were answered to the patient's satisfaction.     Bjorn Pippin

## 2018-10-20 ENCOUNTER — Encounter (HOSPITAL_COMMUNITY): Payer: Self-pay | Admitting: Urology
